# Patient Record
Sex: Female | Born: 1970 | Race: Black or African American | Hispanic: No | Marital: Single | Smoking: Current every day smoker
Health system: Southern US, Community
[De-identification: ages and names within clinical notes are randomized; demographics above are authoritative.]

## PROBLEM LIST (undated history)

## (undated) DIAGNOSIS — F329 Major depressive disorder, single episode, unspecified: Secondary | ICD-10-CM

## (undated) DIAGNOSIS — R519 Headache, unspecified: Secondary | ICD-10-CM

## (undated) DIAGNOSIS — T8859XA Other complications of anesthesia, initial encounter: Secondary | ICD-10-CM

## (undated) DIAGNOSIS — T4145XA Adverse effect of unspecified anesthetic, initial encounter: Secondary | ICD-10-CM

## (undated) DIAGNOSIS — N879 Dysplasia of cervix uteri, unspecified: Secondary | ICD-10-CM

## (undated) DIAGNOSIS — N63 Unspecified lump in unspecified breast: Secondary | ICD-10-CM

## (undated) DIAGNOSIS — F419 Anxiety disorder, unspecified: Secondary | ICD-10-CM

## (undated) DIAGNOSIS — F32A Depression, unspecified: Secondary | ICD-10-CM

## (undated) DIAGNOSIS — R51 Headache: Secondary | ICD-10-CM

## (undated) DIAGNOSIS — N6001 Solitary cyst of right breast: Secondary | ICD-10-CM

## (undated) DIAGNOSIS — K219 Gastro-esophageal reflux disease without esophagitis: Secondary | ICD-10-CM

## (undated) HISTORY — PX: APPENDECTOMY: SHX54

## (undated) HISTORY — DX: Solitary cyst of right breast: N60.01

## (undated) HISTORY — DX: Dysplasia of cervix uteri, unspecified: N87.9

## (undated) HISTORY — PX: CRYOTHERAPY: SHX1416

## (undated) HISTORY — DX: Unspecified lump in unspecified breast: N63.0

## (undated) HISTORY — PX: BREAST MASS EXCISION: SHX1267

---

## 2002-09-19 HISTORY — PX: TUBAL LIGATION: SHX77

## 2002-09-19 HISTORY — PX: BREAST BIOPSY: SHX20

## 2009-04-09 ENCOUNTER — Emergency Department: Payer: Self-pay | Admitting: Unknown Physician Specialty

## 2011-03-15 ENCOUNTER — Emergency Department: Payer: Self-pay | Admitting: Emergency Medicine

## 2011-12-07 ENCOUNTER — Ambulatory Visit: Payer: Self-pay | Admitting: Obstetrics and Gynecology

## 2013-09-19 DIAGNOSIS — N6001 Solitary cyst of right breast: Secondary | ICD-10-CM

## 2013-09-19 HISTORY — DX: Solitary cyst of right breast: N60.01

## 2014-03-31 ENCOUNTER — Ambulatory Visit: Payer: Self-pay | Admitting: Obstetrics and Gynecology

## 2014-04-02 ENCOUNTER — Ambulatory Visit: Payer: Self-pay | Admitting: Obstetrics and Gynecology

## 2014-07-18 DIAGNOSIS — Z720 Tobacco use: Secondary | ICD-10-CM | POA: Insufficient documentation

## 2014-08-25 ENCOUNTER — Ambulatory Visit: Payer: Self-pay | Admitting: Internal Medicine

## 2018-10-08 ENCOUNTER — Ambulatory Visit (INDEPENDENT_AMBULATORY_CARE_PROVIDER_SITE_OTHER): Payer: Managed Care, Other (non HMO) | Admitting: Obstetrics and Gynecology

## 2018-10-08 ENCOUNTER — Encounter: Payer: Self-pay | Admitting: Obstetrics and Gynecology

## 2018-10-08 VITALS — BP 114/66 | HR 90 | Ht 63.0 in | Wt 134.0 lb

## 2018-10-08 DIAGNOSIS — R102 Pelvic and perineal pain: Secondary | ICD-10-CM

## 2018-10-08 DIAGNOSIS — R350 Frequency of micturition: Secondary | ICD-10-CM

## 2018-10-08 LAB — POCT URINALYSIS DIPSTICK
BILIRUBIN UA: NEGATIVE
Blood, UA: NEGATIVE
GLUCOSE UA: NEGATIVE
KETONES UA: NEGATIVE
Leukocytes, UA: NEGATIVE
Nitrite, UA: NEGATIVE
Protein, UA: NEGATIVE
Spec Grav, UA: 1.01 (ref 1.010–1.025)
pH, UA: 6 (ref 5.0–8.0)

## 2018-10-08 NOTE — Progress Notes (Signed)
Patient, No Pcp Per   Chief Complaint  Patient presents with  . Urinary Tract Infection    cramping a lot, frequency at times, no burning sensation/blood noticed/odor x 3 days, started azo on sat  . [employer] Employee    HPI:      [name] is a 48 y.o. No obstetric history on file. who LMP was Patient's last menstrual period was 09/28/2018 (exact date)., presents today for NP UTI sx of urinary frequency/urgency with suprapubic pain starting 3 days ago, feeling irritated/dry with urination today. No hematuria, urine odor, LBP, fevers. Pain so bad initially that pt had vomiting with trying to void. Took AZO with sx improvement. No vag sx. Sx improved today. Drinks lots of caffeine. Hx of UTIs in past but not recently. Last C&S in Epic 2016 was neg with urin sx. Pt also with constipation issues.  Not current on annual. Has 2/20 appt. She is not currently sex active.   History reviewed. No pertinent past medical history.  Past Surgical History:  Procedure Laterality Date  . APPENDECTOMY     pt was 48 yrs old  . TUBAL LIGATION  2004    Family History  Problem Relation Age of Onset  . Hypertension Mother   . Other Father        colon polyps  . Hypertension Father   . Cancer Maternal Grandmother        breast  . Cancer Maternal Grandfather        lung  . Cancer Paternal Grandmother        breast  . Cancer Paternal Grandfather     Social History   Socioeconomic History  . Marital status: Single    Spouse name: Not on file  . Number of children: Not on file  . Years of education: Not on file  . Highest education level: Not on file  Occupational History  . Not on file  Social Needs  . Financial resource strain: Not on file  . Food insecurity:    Worry: Not on file    Inability: Not on file  . Transportation needs:    Medical: Not on file    Non-medical: Not on file  Tobacco Use  . Smoking status: Current Every Day Smoker  . Smokeless tobacco: Never Used    Substance and Sexual Activity  . Alcohol use: Not Currently  . Drug use: Never  . Sexual activity: Not Currently    Birth control/protection: Surgical    Comment: tubal ligation  Lifestyle  . Physical activity:    Days per week: Not on file    Minutes per session: Not on file  . Stress: Not on file  Relationships  . Social connections:    Talks on phone: Not on file    Gets together: Not on file    Attends religious service: Not on file    Active member of club or organization: Not on file    Attends meetings of clubs or organizations: Not on file    Relationship status: Not on file  . Intimate partner violence:    Fear of current or ex partner: Not on file    Emotionally abused: Not on file    Physically abused: Not on file    Forced sexual activity: Not on file  Other Topics Concern  . Not on file  Social History Narrative  . Not on file    No outpatient medications prior to visit.  No facility-administered medications prior to visit.       ROS:  Review of Systems  Constitutional: Negative for fatigue, fever and unexpected weight change.  Respiratory: Negative for cough, shortness of breath and wheezing.   Cardiovascular: Negative for chest pain, palpitations and leg swelling.  Gastrointestinal: Negative for blood in stool, constipation, diarrhea, nausea and vomiting.  Endocrine: Negative for cold intolerance, heat intolerance and polyuria.  Genitourinary: Positive for dysuria, frequency, pelvic pain and urgency. Negative for dyspareunia, flank pain, genital sores, hematuria, menstrual problem, vaginal bleeding, vaginal discharge and vaginal pain.  Musculoskeletal: Negative for back pain, joint swelling and myalgias.  Skin: Negative for rash.  Neurological: Negative for dizziness, syncope, light-headedness, numbness and headaches.  Hematological: Negative for adenopathy.  Psychiatric/Behavioral: Negative for agitation, confusion, sleep disturbance and suicidal  ideas. The patient is not nervous/anxious.    BREAST: No symptoms   OBJECTIVE:   Vitals:  BP 114/66   Pulse 90   Ht 5\' 3"  (1.6 m)   Wt 134 lb (60.8 kg)   LMP 09/28/2018 (Exact Date)   BMI 23.74 kg/m   Physical Exam Vitals signs reviewed.  Constitutional:      Appearance: She is well-developed. She is not ill-appearing or toxic-appearing.  Neck:     Musculoskeletal: Normal range of motion.  Pulmonary:     Effort: Pulmonary effort is normal.  Abdominal:     Palpations: Abdomen is soft. There is no mass.     Tenderness: There is abdominal tenderness. There is no right CVA tenderness, left CVA tenderness or guarding.  Genitourinary:    Pubic Area: No rash.      Labia:        Right: No rash, tenderness or lesion.        Left: No rash, tenderness or lesion.      Vagina: Normal. No vaginal discharge, erythema or tenderness.     Cervix: Normal.     Uterus: Normal. Tender. Not enlarged.      Adnexa: Right adnexa normal.       Right: No mass or tenderness.         Left: No mass or tenderness.    Musculoskeletal: Normal range of motion.  Neurological:     General: No focal deficit present.     Mental Status: She is alert and oriented to person, place, and time.     Cranial Nerves: No cranial nerve deficit.  Psychiatric:        Behavior: Behavior normal.        Thought Content: Thought content normal.        Judgment: Judgment normal.     Results: Results for orders placed or performed in visit on 10/08/18 (from the past 24 hour(s))  POCT Urinalysis Dipstick     Status: Normal   Collection Time: 10/08/18  2:57 PM  Result Value Ref Range   Color, UA yellow    Clarity, UA clear    Glucose, UA Negative Negative   Bilirubin, UA neg    Ketones, UA neg    Spec Grav, UA 1.010 1.010 - 1.025   Blood, UA neg    pH, UA 6.0 5.0 - 8.0   Protein, UA Negative Negative   Urobilinogen, UA     Nitrite, UA neg    Leukocytes, UA Negative Negative   Appearance     Odor        Assessment/Plan: Urinary frequency - Neg UA. Check C&S. Will call with results/sx f/u. If neg, may  be caffeine related. Decrease caffeine/increase water. F/u sooner prn. - Plan: POCT Urinalysis Dipstick, Urine Culture  Pelvic pain - Tender on exam. Check C&S. If neg, will see if sx persist. May be GI related with constipation. F/u sooner prn.    No follow-ups on file.  Diana Avila B. Rocio Roam, PA-C 10/08/2018 3:00 PM

## 2018-10-08 NOTE — Patient Instructions (Signed)
I value your feedback and entrusting us with your care. If you get a [facility] patient survey, I would appreciate you taking the time to let us know about your experience today. Thank you! 

## 2018-10-10 LAB — URINE CULTURE: Organism ID, Bacteria: NO GROWTH

## 2018-10-10 NOTE — Progress Notes (Signed)
Pls let pt know C&S neg. Sx could be caffeine related so pt needs to cut back/stop it. F/u prn.

## 2018-10-10 NOTE — Progress Notes (Signed)
Pt aware.

## 2018-10-29 ENCOUNTER — Ambulatory Visit (INDEPENDENT_AMBULATORY_CARE_PROVIDER_SITE_OTHER): Payer: Managed Care, Other (non HMO) | Admitting: Obstetrics and Gynecology

## 2018-10-29 ENCOUNTER — Encounter: Payer: Self-pay | Admitting: Obstetrics and Gynecology

## 2018-10-29 VITALS — BP 108/70 | HR 97 | Ht 63.0 in | Wt 131.0 lb

## 2018-10-29 DIAGNOSIS — Z124 Encounter for screening for malignant neoplasm of cervix: Secondary | ICD-10-CM

## 2018-10-29 DIAGNOSIS — Z01419 Encounter for gynecological examination (general) (routine) without abnormal findings: Secondary | ICD-10-CM | POA: Diagnosis not present

## 2018-10-29 DIAGNOSIS — R928 Other abnormal and inconclusive findings on diagnostic imaging of breast: Secondary | ICD-10-CM

## 2018-10-29 DIAGNOSIS — Z1239 Encounter for other screening for malignant neoplasm of breast: Secondary | ICD-10-CM

## 2018-10-29 DIAGNOSIS — Z1151 Encounter for screening for human papillomavirus (HPV): Secondary | ICD-10-CM

## 2018-10-29 NOTE — Patient Instructions (Signed)
I value your feedback and entrusting us with your care. If you get a [facility] patient survey, I would appreciate you taking the time to let us know about your experience today. Thank you!  [facility]: [phone]    

## 2018-10-29 NOTE — Progress Notes (Signed)
PCP:  Patient, No Pcp Per   Chief Complaint  Patient presents with  . Gynecologic Exam  . [employer] Employee     HPI:      [name] is a 48 y.o. [id number] who LMP was Patient's last menstrual period was 10/21/2018 (exact date)., presents today for her annual examination.  Her menses are regular every 28-30 days, lasting 4 days.  Dysmenorrhea mild, occurring first 1-2 days of flow. She does not have intermenstrual bleeding.  Sex activity: not sexually active.  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 There is a FH of breast cancer in her MGM and PGM, ages unknown. There is no FH of ovarian cancer. The patient does not do self-breast exams.  Tobacco use: The patient currently smokes 1/2 packs of cigarettes per day for the past many years. Tried chantix without success, trying to quit. Alcohol use: none No drug use.  Exercise: moderately active  She does not get adequate calcium and Vitamin D in her diet. Hx of Vit D deficiency in past. Labs at work.   Past Medical History:  Diagnosis Date  . Breast cyst, right 2015  . Breast mass    with exc in her 22s  . Cervical dysplasia     Past Surgical History:  Procedure Laterality Date  . APPENDECTOMY     pt was 48 yrs old  . BREAST MASS EXCISION     in her 29s  . CRYOTHERAPY     in her 79s  . TUBAL LIGATION  2004    Family History  Problem Relation Age of Onset  . Hypertension Mother   . Other Father        colon polyps  . Hypertension Father   . Breast cancer Maternal Grandmother        ? age  . Cancer Maternal Grandfather        lung  . Breast cancer Paternal Grandmother        ? age  . Cancer Paternal Grandfather     Social History   Socioeconomic History  . Marital status: Single    Spouse name: Not on file  . Number of  children: Not on file  . Years of education: Not on file  . Highest education level: Not on file  Occupational History  . Not on file  Social Needs  . Financial resource strain: Not on file  . Food insecurity:    Worry: Not on file    Inability: Not on file  . Transportation needs:    Medical: Not on file    Non-medical: Not on file  Tobacco Use  . Smoking status: Current Every Day Smoker  . Smokeless tobacco: Never Used  Substance and Sexual Activity  . Alcohol use: Yes    Comment: occ  . Drug use: Never  . Sexual activity: Not Currently    Birth control/protection: Surgical    Comment: tubal ligation  Lifestyle  . Physical activity:    Days per week: Not on file    Minutes per session: Not on file  . Stress: Not on file  Relationships  . Social connections:    Talks on phone: Not on file    Gets together: Not on file    Attends religious service:  Not on file    Active member of club or organization: Not on file    Attends meetings of clubs or organizations: Not on file    Relationship status: Not on file  . Intimate partner violence:    Fear of current or ex partner: Not on file    Emotionally abused: Not on file    Physically abused: Not on file    Forced sexual activity: Not on file  Other Topics Concern  . Not on file  Social History Narrative  . Not on file    No outpatient medications prior to visit.   No facility-administered medications prior to visit.       ROS:  Review of Systems  Constitutional: Negative for fatigue, fever and unexpected weight change.  Respiratory: Negative for cough, shortness of breath and wheezing.   Cardiovascular: Negative for chest pain, palpitations and leg swelling.  Gastrointestinal: Negative for blood in stool, constipation, diarrhea, nausea and vomiting.  Endocrine: Negative for cold intolerance, heat intolerance and polyuria.  Genitourinary: Negative for dyspareunia, dysuria, flank pain, frequency, genital sores,  hematuria, menstrual problem, pelvic pain, urgency, vaginal bleeding, vaginal discharge and vaginal pain.  Musculoskeletal: Negative for back pain, joint swelling and myalgias.  Skin: Negative for rash.  Neurological: Negative for dizziness, syncope, light-headedness, numbness and headaches.  Hematological: Negative for adenopathy.  Psychiatric/Behavioral: Negative for agitation, confusion, sleep disturbance and suicidal ideas. The patient is not nervous/anxious.    BREAST: No symptoms   Objective: BP 108/70   Pulse 97   Ht 5\' 3"  (1.6 m)   Wt 131 lb (59.4 kg)   LMP 10/21/2018 (Exact Date)   BMI 23.21 kg/m    Physical Exam Constitutional:      Appearance: She is well-developed.  Genitourinary:     Vulva, vagina, cervix, uterus, right adnexa and left adnexa normal.     No vulval lesion or tenderness noted.     No vaginal discharge, erythema or tenderness.     No cervical polyp.     Uterus is not enlarged or tender.     No right or left adnexal mass present.     Right adnexa not tender.     Left adnexa not tender.  Neck:     Musculoskeletal: Normal range of motion.     Thyroid: No thyromegaly.  Cardiovascular:     Rate and Rhythm: Normal rate and regular rhythm.     Heart sounds: Normal heart sounds. No murmur.  Pulmonary:     Effort: Pulmonary effort is normal.     Breath sounds: Normal breath sounds.  Chest:     Breasts:        Right: No mass, nipple discharge, skin change or tenderness.        Left: No mass, nipple discharge, skin change or tenderness.  Abdominal:     Palpations: Abdomen is soft.     Tenderness: There is no abdominal tenderness. There is no guarding.  Musculoskeletal: Normal range of motion.  Neurological:     Mental Status: She is alert and oriented to person, place, and time.     Cranial Nerves: No cranial nerve deficit.  Psychiatric:        Behavior: Behavior normal.  Vitals signs reviewed.     Assessment/Plan: Encounter for annual  routine gynecological examination  Cervical cancer screening - Plan: IGP, Aptima HPV  Screening for HPV (human papillomavirus) - Plan: IGP, Aptima HPV  Screening for breast cancer - Check dx mammo and  RT breast u/s, f/u from 2015 cat 3 mammo.  - Plan: US BREAST LTD UNI RIGHT INC AXILLA, MM DIAG BREAST TOMO BILATERAL  Abnormal mammogram of right breast - Plan: US BREAST LTD UNI RIGHT INC AXILLA, MM DIAG BREAST TOMO BILATERAL         GYN counsel breast self exam, mammography screening, adequate intake of calcium and vitamin D, diet and exercise     F/U  Return in about 1 year (around 10/30/2019).  Bereket Gernert B. Greysen Swanton, PA-C 10/29/2018 3:20 PM

## 2018-11-04 LAB — IGP, APTIMA HPV: HPV Aptima: NEGATIVE

## 2018-11-09 ENCOUNTER — Ambulatory Visit
Admission: RE | Admit: 2018-11-09 | Discharge: 2018-11-09 | Disposition: A | Discharge status: Home / Self Care | Payer: Managed Care, Other (non HMO) | Source: Ambulatory Visit | Attending: Obstetrics and Gynecology | Admitting: Obstetrics and Gynecology

## 2018-11-09 ENCOUNTER — Other Ambulatory Visit: Payer: Self-pay | Admitting: Obstetrics and Gynecology

## 2018-11-09 DIAGNOSIS — Z1239 Encounter for other screening for malignant neoplasm of breast: Secondary | ICD-10-CM

## 2018-11-09 DIAGNOSIS — N6320 Unspecified lump in the left breast, unspecified quadrant: Secondary | ICD-10-CM

## 2018-11-09 DIAGNOSIS — R928 Other abnormal and inconclusive findings on diagnostic imaging of breast: Secondary | ICD-10-CM

## 2018-11-12 ENCOUNTER — Other Ambulatory Visit: Payer: Self-pay | Admitting: Obstetrics and Gynecology

## 2018-11-12 DIAGNOSIS — R928 Other abnormal and inconclusive findings on diagnostic imaging of breast: Secondary | ICD-10-CM

## 2018-11-12 DIAGNOSIS — N6001 Solitary cyst of right breast: Secondary | ICD-10-CM

## 2018-11-15 ENCOUNTER — Ambulatory Visit
Admission: RE | Admit: 2018-11-15 | Discharge: 2018-11-15 | Disposition: A | Discharge status: Home / Self Care | Payer: Managed Care, Other (non HMO) | Source: Ambulatory Visit | Attending: Obstetrics and Gynecology | Admitting: Obstetrics and Gynecology

## 2018-11-15 DIAGNOSIS — R928 Other abnormal and inconclusive findings on diagnostic imaging of breast: Secondary | ICD-10-CM | POA: Insufficient documentation

## 2018-11-15 HISTORY — PX: BREAST BIOPSY: SHX20

## 2018-11-16 LAB — SURGICAL PATHOLOGY

## 2018-11-19 ENCOUNTER — Other Ambulatory Visit (HOSPITAL_COMMUNITY)
Admission: RE | Admit: 2018-11-19 | Discharge: 2018-11-19 | Disposition: A | Discharge status: Home / Self Care | Payer: Managed Care, Other (non HMO) | Source: Ambulatory Visit | Attending: Obstetrics and Gynecology | Admitting: Obstetrics and Gynecology

## 2018-11-19 ENCOUNTER — Ambulatory Visit (INDEPENDENT_AMBULATORY_CARE_PROVIDER_SITE_OTHER): Payer: Managed Care, Other (non HMO) | Admitting: Obstetrics and Gynecology

## 2018-11-19 ENCOUNTER — Telehealth: Payer: Self-pay | Admitting: Obstetrics and Gynecology

## 2018-11-19 ENCOUNTER — Encounter: Payer: Self-pay | Admitting: Obstetrics and Gynecology

## 2018-11-19 VITALS — BP 108/58 | HR 85 | Ht 63.0 in | Wt 130.0 lb

## 2018-11-19 DIAGNOSIS — R87612 Low grade squamous intraepithelial lesion on cytologic smear of cervix (LGSIL): Secondary | ICD-10-CM | POA: Insufficient documentation

## 2018-11-19 DIAGNOSIS — N870 Mild cervical dysplasia: Secondary | ICD-10-CM

## 2018-11-19 DIAGNOSIS — N6320 Unspecified lump in the left breast, unspecified quadrant: Secondary | ICD-10-CM

## 2018-11-19 DIAGNOSIS — R897 Abnormal histological findings in specimens from other organs, systems and tissues: Secondary | ICD-10-CM

## 2018-11-19 NOTE — Telephone Encounter (Signed)
I did confirm with Dr. Jeanmarie Plant with the pathology results for Ms. Berkery. She is recommending a surgical referral for excision for the complex sclerosing lesion.   Thanks.  Jetta Lout

## 2018-11-19 NOTE — Progress Notes (Signed)
   GYNECOLOGY CLINIC COLPOSCOPY PROCEDURE NOTE  48 y.o. [id number] here for colposcopy for low-grade squamous intraepithelial neoplasia (LGSIL - encompassing HPV,mild dysplasia,CIN I)  pap smear on 10/29/2018. Discussed underlying role for HPV infection in the development of cervical dysplasia, its natural history and progression/regression, need for surveillance.  Is the patient  pregnant: No LMP: Patient's last menstrual period was 10/21/2018 (exact date). Smoking status:  reports that she has been smoking. She has never used smokeless tobacco.  Patient given informed consent, signed copy in the chart, time out was performed.  The patient was position in dorsal lithotomy position. Speculum was placed the cervix was visualized.   After application of acetic acid colposcopic inspection of the cervix was undertaken.   Colposcopy adequate, full visualization of transformation zone: No acetowhite lesion(s) noted at 12 o'clock; corresponding biopsies obtained.   ECC specimen obtained:  Yes  All specimens were labeled and sent to pathology.   Patient was given post procedure instructions.  Will follow up pathology and manage accordingly.  Routine preventative health maintenance measures emphasized.  Physical Exam Genitourinary:      Encouraged smoking cessation. Given information. Will return to discuss.   Patient felt dizzy and nauseous at the end of the procedure. Reported that she had not had any food today.  Given crackers and a sprite. She felt better after some time of resting and was able to go home.   Adrian Prows MD [facility] 11/19/2018 4:27 PM

## 2018-11-19 NOTE — Telephone Encounter (Signed)
Pt aware of breast bx report. Dr. Jeanmarie Plant recommended surg exc. Will refer to Dr. Bary Castilla for mgmt.

## 2018-11-22 ENCOUNTER — Ambulatory Visit (INDEPENDENT_AMBULATORY_CARE_PROVIDER_SITE_OTHER): Payer: Managed Care, Other (non HMO)

## 2018-11-22 ENCOUNTER — Other Ambulatory Visit: Payer: Self-pay

## 2018-11-22 ENCOUNTER — Encounter: Payer: Self-pay | Admitting: General Surgery

## 2018-11-22 ENCOUNTER — Ambulatory Visit (INDEPENDENT_AMBULATORY_CARE_PROVIDER_SITE_OTHER): Payer: Managed Care, Other (non HMO) | Admitting: General Surgery

## 2018-11-22 VITALS — BP 110/78 | HR 71 | Temp 97.7°F | Resp 18 | Ht 63.0 in | Wt 129.8 lb

## 2018-11-22 DIAGNOSIS — N6489 Other specified disorders of breast: Secondary | ICD-10-CM | POA: Diagnosis not present

## 2018-11-22 DIAGNOSIS — N6320 Unspecified lump in the left breast, unspecified quadrant: Secondary | ICD-10-CM | POA: Diagnosis not present

## 2018-11-22 NOTE — Progress Notes (Signed)
Patient ID: Diana Avila, female   DOB: [birth year], 48 y.o.   MRN: [id number]  Chief Complaint  Patient presents with  . New Patient (Initial Visit)    Left Breast biopsy 11/15/2018    HPI Diana Avila is a 48 y.o. female.  Here today for left breast biopsy consult referred by Elmo Putt Copland. She has felt the area of concern for "some time". Clear discharge bilateral breast usually after showers for at least 10 years. No skin changes. The stereotatic left breast biopsy was 11-15-18, showing complex sclerosing lesion left breast. She does admit to increased caffeine intake. She works at [employer].     HPI  Past Medical History:  Diagnosis Date  . Breast cyst, right 2015  . Breast mass    with exc in her 55s  . Cervical dysplasia     Past Surgical History:  Procedure Laterality Date  . APPENDECTOMY     pt was 48 yrs old  . BREAST BIOPSY Left 2004   neg  . BREAST BIOPSY Left 11/15/2018   affirm stereo biopsy for distortion/ x clip/ FEATURES OF COMPLEX SCLEROSING LESION, WITH SCLEROSING ADENOSIS  . BREAST MASS EXCISION     in her 67s  . CRYOTHERAPY     in her 8s  . TUBAL LIGATION  2004    Family History  Problem Relation Age of Onset  . Hypertension Mother   . Other Father        colon polyps  . Hypertension Father   . Diverticulitis Father   . Gout Father   . Breast cancer Maternal Grandmother        ? age  . Cancer Maternal Grandfather        lung  . Breast cancer Paternal Grandmother        ? age  . Cancer Paternal Grandfather     Social History Social History   Tobacco Use  . Smoking status: Current Every Day Smoker    Packs/day: 0.25  . Smokeless tobacco: Never Used  Substance Use Topics  . Alcohol use: Yes    Comment: occ  . Drug use: Never    Allergies  Allergen Reactions  . Sulfa Antibiotics Hives and Rash    No current outpatient medications on file.   No current facility-administered medications for this visit.     Review of  Systems Review of Systems  Constitutional: Negative.   Respiratory: Negative.   Cardiovascular: Negative.     Blood pressure 110/78, pulse 71, temperature 97.7 F (36.5 C), temperature source Temporal, resp. rate 18, height 5\' 3"  (1.6 m), weight 129 lb 12.8 oz (58.9 kg), last menstrual period 10/21/2018, SpO2 97 %.  Physical Exam Physical Exam Exam conducted with a chaperone present.  Constitutional:      Appearance: She is well-developed.  Eyes:     General: No scleral icterus.    Conjunctiva/sclera: Conjunctivae normal.  Neck:     Musculoskeletal: Normal range of motion and neck supple.  Cardiovascular:     Rate and Rhythm: Normal rate and regular rhythm.     Heart sounds: Normal heart sounds.  Pulmonary:     Effort: Pulmonary effort is normal.     Breath sounds: Normal breath sounds.  Chest:     Breasts:        Right: Nipple discharge present. No inverted nipple, mass, skin change or tenderness.        Left: No inverted nipple, mass, nipple discharge, skin change or tenderness.  Comments: Single duct drainage right breast.  Lymphadenopathy:     Cervical: No cervical adenopathy.     Upper Body:     Right upper body: No supraclavicular or axillary adenopathy.     Left upper body: No supraclavicular or axillary adenopathy.  Skin:    General: Skin is warm and dry.  Neurological:     Mental Status: She is alert and oriented to person, place, and time.  Psychiatric:        Behavior: Behavior normal.     Data Reviewed November 15, 2018:  BREAST, LEFT, LOWER OUTER QUADRANT POSTERIOR; STEREOTACTIC-GUIDED  CORE BIOPSY:  - FEATURES OF COMPLEX SCLEROSING LESION, WITH SCLEROSING ADENOSIS,  COLUMNAR CELL CHANGE, AND APOCRINE METAPLASIA.  - NEGATIVE FOR ATYPIA AND MALIGNANCY.   Limited ultrasound examination was completed to determine if preoperative wire localization would be advisable.  The patient has multiple large cysts in the breast the largest measuring 1.2 x  3.6 x 3.9 cm is located the 2 o'clock position 3 cm from the nipple.  At the 3 o'clock position, 3 cm from the nipple there is an ill-defined hypoechoic area with potentially a clip, but the findings are not distinct enough to proceed for ultrasound guidance during biopsy.  BI-RADS-3  Assessment Radial scar of the left breast.  Plan  Indications for surgical excision to exclude upstaging was discussed.   Recommend excision left breast mass-wire localization   Bra size 36 C.  HPI, assessment, plan and physical exam has been scribed under the direction and in the presence of Robert Bellow, MD. Karie Fetch, RN  HPI, Physical Exam, Assessment and Plan have been scribed under the direction and in the presence of Robert Bellow, MD. [name], CMA  I have completed the exam and reviewed the above documentation for accuracy and completeness.  I agree with the above.  Haematologist has been used and any errors in dictation or transcription are unintentional.  [name], M.D., F.A.C.S.  [name] [name] 11/23/2018, 2:26 PM  Patient's surgery to be scheduled for 12-05-18 at [facility] with Dr. Bary Castilla.  The patient is aware she will be contacted by the [facility] to complete a phone interview sometime in the near future.  The patient is aware to call the office should she have further questions.   Dominga Ferry, CMA

## 2018-11-22 NOTE — Progress Notes (Signed)
CIN1, repeat pap smear in 1 year. Discussed with patient on the phone.  -[place]

## 2018-11-22 NOTE — Patient Instructions (Addendum)
The patient is aware to call back for any questions or new concerns.  Recommend excision left breast mass-wire localization

## 2018-11-23 DIAGNOSIS — N6489 Other specified disorders of breast: Secondary | ICD-10-CM | POA: Insufficient documentation

## 2018-11-26 ENCOUNTER — Other Ambulatory Visit: Payer: Self-pay | Admitting: General Surgery

## 2018-11-26 DIAGNOSIS — N6489 Other specified disorders of breast: Secondary | ICD-10-CM

## 2018-11-27 ENCOUNTER — Telehealth: Payer: Self-pay | Admitting: General Surgery

## 2018-11-27 NOTE — Telephone Encounter (Signed)
Pt advised of pre op date/time and sx date. Sx: 12/05/18 with Dr Orlene Och breast excisional biopsy with NL.  Pre op: 11/29/2018  Between 9-1:00pm-phone interview.   Patient made aware to arrive at [facility] at 7:45am the morning of surgery. Patient made aware that she does not have to call the day prior to surgery for arrival time.

## 2018-11-29 ENCOUNTER — Encounter
Admission: RE | Admit: 2018-11-29 | Discharge: 2018-11-29 | Disposition: A | Discharge status: Home / Self Care | Payer: Managed Care, Other (non HMO) | Source: Ambulatory Visit | Attending: General Surgery | Admitting: General Surgery

## 2018-11-29 ENCOUNTER — Other Ambulatory Visit: Payer: Self-pay

## 2018-11-29 HISTORY — DX: Major depressive disorder, single episode, unspecified: F32.9

## 2018-11-29 HISTORY — DX: Other complications of anesthesia, initial encounter: T88.59XA

## 2018-11-29 HISTORY — DX: Gastro-esophageal reflux disease without esophagitis: K21.9

## 2018-11-29 HISTORY — DX: Anxiety disorder, unspecified: F41.9

## 2018-11-29 HISTORY — DX: Headache, unspecified: R51.9

## 2018-11-29 HISTORY — DX: Headache: R51

## 2018-11-29 HISTORY — DX: Depression, unspecified: F32.A

## 2018-11-29 HISTORY — DX: Adverse effect of unspecified anesthetic, initial encounter: T41.45XA

## 2018-11-29 NOTE — Patient Instructions (Addendum)
Your procedure is scheduled on: 12-05-18 Report to [facility] AT 7:45 AM Remember: Instructions that are not followed completely may result in serious medical risk, up to and including death, or upon the discretion of your surgeon and anesthesiologist your surgery may need to be rescheduled.    _x___ 1. Do not eat food after midnight the night before your procedure. You may drink clear liquids up to 2 hours before you are scheduled to arrive at the hospital for your procedure.  Do not drink clear liquids within 2 hours of your scheduled arrival to the hospital.  Clear liquids include  --Water or Apple juice without pulp  --Clear carbohydrate beverage such as ClearFast or Gatorade  --Black Coffee or Clear Tea (No milk, no creamers, do not add anything to the coffee or Tea   ____Ensure clear carbohydrate drink on the way to the hospital for bariatric patients  ____Ensure clear carbohydrate drink 3 hours before surgery for Dr Dwyane Luo patients if physician instructed.   No gum chewing or hard candies.     __x__ 2. No Alcohol for 24 hours before or after surgery.   __x__3. No Smoking or e-cigarettes for 24 prior to surgery.  Do not use any chewable tobacco products for at least 6 hour prior to surgery   ____  4. Bring all medications with you on the day of surgery if instructed.    __x__ 5. Notify your doctor if there is any change in your medical condition     (cold, fever, infections).    x___6. On the morning of surgery brush your teeth with toothpaste and water.  You may rinse your mouth with mouth wash if you wish.  Do not swallow any toothpaste or mouthwash.   Do not wear jewelry, make-up, hairpins, clips or nail polish.  Do not wear lotions, powders, or perfumes. You may wear deodorant.  Do not shave 48 hours prior to surgery. Men may shave face and neck.  Do not bring valuables to the hospital.    [facility] is not responsible for any belongings or valuables.    Contacts, dentures or bridgework may not be worn into surgery.  Leave your suitcase in the car. After surgery it may be brought to your room.  For patients admitted to the hospital, discharge time is determined by your                       treatment team.  _  Patients discharged the day of surgery will not be allowed to drive home.  You will need someone to drive you home and stay with you the night of your procedure.    Please read over the following fact sheets that you were given:   [facility] Preparing for Surgery and or MRSA Information   ____ Take anti-hypertensive listed below, cardiac, seizure, asthma,anti-reflux and psychiatric medicines. These include:  1. NONE  2.  3.  4.  5.  6.  ____Fleets enema or Magnesium Citrate as directed.   ____ Use CHG Soap or sage wipes as directed on instruction sheet   ____ Use inhalers on the day of surgery and bring to hospital day of surgery  ____ Stop Metformin and Janumet 2 days prior to surgery.    ____ Take 1/2 of usual insulin dose the night before surgery and none on the morning surgery.   ____ Follow recommendations from Cardiologist, Pulmonologist or PCP regarding stopping Aspirin, Coumadin, Plavix ,Eliquis, Effient, or Pradaxa, and  Pletal.  ____Stop Anti-inflammatories such as Advil, Aleve, Ibuprofen, Motrin, Naproxen, Naprosyn, Goodies powders or aspirin products NOW-OK to take Tylenol    ____ Stop supplements until after surgery.     ____ Bring C-Pap to the hospital.

## 2018-12-03 ENCOUNTER — Ambulatory Visit (INDEPENDENT_AMBULATORY_CARE_PROVIDER_SITE_OTHER): Payer: Managed Care, Other (non HMO) | Admitting: Obstetrics and Gynecology

## 2018-12-03 ENCOUNTER — Other Ambulatory Visit: Payer: Self-pay

## 2018-12-03 ENCOUNTER — Encounter: Payer: Self-pay | Admitting: Obstetrics and Gynecology

## 2018-12-03 VITALS — BP 100/70 | HR 89 | Ht 63.0 in | Wt 129.0 lb

## 2018-12-03 DIAGNOSIS — N760 Acute vaginitis: Secondary | ICD-10-CM

## 2018-12-03 DIAGNOSIS — Z720 Tobacco use: Secondary | ICD-10-CM | POA: Diagnosis not present

## 2018-12-03 DIAGNOSIS — B9689 Other specified bacterial agents as the cause of diseases classified elsewhere: Secondary | ICD-10-CM | POA: Diagnosis not present

## 2018-12-03 LAB — POCT WET PREP WITH KOH
Clue Cells Wet Prep HPF POC: POSITIVE
KOH PREP POC: POSITIVE — AB
Trichomonas, UA: NEGATIVE
Yeast Wet Prep HPF POC: NEGATIVE

## 2018-12-03 MED ORDER — NICOTINE 7 MG/24HR TD PT24: 7.0000 mg | MEDICATED_PATCH | Freq: Every day | TRANSDERMAL | 0 refills | Status: DC

## 2018-12-03 MED ORDER — NICOTINE 14 MG/24HR TD PT24: 14.0000 mg | MEDICATED_PATCH | Freq: Every day | TRANSDERMAL | 0 refills | Status: DC

## 2018-12-03 MED ORDER — FLUCONAZOLE 150 MG PO TABS: 150.0000 mg | ORAL_TABLET | Freq: Once | ORAL | 0 refills | Status: AC

## 2018-12-03 MED ORDER — METRONIDAZOLE 500 MG PO TABS: 500.0000 mg | ORAL_TABLET | Freq: Two times a day (BID) | ORAL | 0 refills | Status: AC

## 2018-12-03 NOTE — Patient Instructions (Signed)
I value your feedback and entrusting us with your care. If you get a [facility] patient survey, I would appreciate you taking the time to let us know about your experience today. Thank you! 

## 2018-12-03 NOTE — Progress Notes (Signed)
Amorie Rentz, Deirdre Evener, PA-C   Chief Complaint  Patient presents with  . smoking visit    Cessation visit  . Vaginal odor    irritation and little discharge, no itchiness x 3 days  . [employer] Employee    HPI:      [name] is a 48 y.o. [id number] who LMP was Patient's last menstrual period was 11/25/2018 (exact date)., presents today for increased d/c with odor, irritation for several days. No recent abx use, no meds to treat. Hx of BV in past. Not sex active. Uses water to wash, no dryer sheets, no probiotic use.   Current on annual 2/20. Having radial scar removed 12/05/18. 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  Pt also wants to quit tobacco. Smokes 1/2 ppd for many yrs. Tried chantix in past with cessation but caused GI issues and pt couldn't finish tx, so resumed smoking. Interested in trying nicoderm patch but wants Rx for FSA.    Past Medical History:  Diagnosis Date  . Anxiety    H/O  . Breast cyst, right 2015  . Breast mass    with exc in her 7s  . Cervical dysplasia   . Complication of anesthesia    WOKE UP DURING APPENDECTOMY  . Depression    H/O  . GERD (gastroesophageal reflux disease)    NO MEDS  . Headache    MIGRAINES    Past Surgical History:  Procedure Laterality Date  . APPENDECTOMY     pt was 48 yrs old-DEVELOPED FLUID ON RIGHT LUNG AFTER SURGERY-NO PROBLEMS SINCE THEN  . BREAST BIOPSY Left 2004   neg  . BREAST BIOPSY Left 11/15/2018   affirm stereo biopsy for distortion/ x clip/ FEATURES OF COMPLEX SCLEROSING LESION, WITH SCLEROSING ADENOSIS  . BREAST MASS EXCISION     in her 46s  . CRYOTHERAPY     in her 11s  . TUBAL LIGATION  2004    Family History  Problem Relation Age of Onset  . Hypertension Mother   . Other Father        colon polyps  . Hypertension Father   . Diverticulitis Father   . Gout Father   . Breast cancer Maternal Grandmother        ? age  . Cancer Maternal Grandfather        lung  . Breast cancer Paternal Grandmother         ? age  . Cancer Paternal Grandfather     Social History   Socioeconomic History  . Marital status: Single    Spouse name: Not on file  . Number of children: Not on file  . Years of education: Not on file  . Highest education level: Not on file  Occupational History  . Not on file  Social Needs  . Financial resource strain: Not on file  . Food insecurity:    Worry: Not on file    Inability: Not on file  . Transportation needs:    Medical: Not on file    Non-medical: Not on file  Tobacco Use  . Smoking status: Current Every Day Smoker    Packs/day: 0.50    Years: 15.00    Pack years: 7.50    Types: Cigarettes  . Smokeless tobacco: Never Used  Substance and Sexual Activity  . Alcohol use: Yes    Comment: occ  . Drug use: Never  . Sexual activity: Not Currently    Birth control/protection: Surgical  Comment: tubal ligation  Lifestyle  . Physical activity:    Days per week: Not on file    Minutes per session: Not on file  . Stress: Not on file  Relationships  . Social connections:    Talks on phone: Not on file    Gets together: Not on file    Attends religious service: Not on file    Active member of club or organization: Not on file    Attends meetings of clubs or organizations: Not on file    Relationship status: Not on file  . Intimate partner violence:    Fear of current or ex partner: Not on file    Emotionally abused: Not on file    Physically abused: Not on file    Forced sexual activity: Not on file  Other Topics Concern  . Not on file  Social History Narrative  . Not on file    Outpatient Medications Prior to Visit  Medication Sig Dispense Refill  . diphenhydrAMINE (BENADRYL) 25 MG tablet Take 25 mg by mouth every 6 (six) hours as needed.     No facility-administered medications prior to visit.       ROS:  Review of Systems  Constitutional: Negative for fever.  Gastrointestinal: Negative for blood in stool, constipation, diarrhea,  nausea and vomiting.  Genitourinary: Positive for vaginal discharge. Negative for dyspareunia, dysuria, flank pain, frequency, hematuria, urgency, vaginal bleeding and vaginal pain.  Musculoskeletal: Negative for back pain.  Skin: Negative for rash.    OBJECTIVE:   Vitals:  BP 100/70   Pulse 89   Ht 5\' 3"  (1.6 m)   Wt 129 lb (58.5 kg)   LMP 11/25/2018 (Exact Date)   BMI 22.85 kg/m   Physical Exam Vitals signs reviewed.  Constitutional:      Appearance: She is well-developed.  Pulmonary:     Effort: Pulmonary effort is normal.  Genitourinary:    General: Normal vulva.     Pubic Area: No rash.      Labia:        Right: No rash, tenderness or lesion.        Left: No rash, tenderness or lesion.      Vagina: Vaginal discharge present. No erythema or tenderness.     Cervix: Normal.     Uterus: Normal. Not enlarged and not tender.      Adnexa: Right adnexa normal and left adnexa normal.       Right: No mass or tenderness.         Left: No mass or tenderness.    Musculoskeletal: Normal range of motion.  Neurological:     Mental Status: She is alert and oriented to person, place, and time.  Psychiatric:        Behavior: Behavior normal.        Thought Content: Thought content normal.     Results: Results for orders placed or performed in visit on 12/03/18 (from the past 24 hour(s))  POCT Wet Prep with KOH     Status: Abnormal   Collection Time: 12/03/18  4:55 PM  Result Value Ref Range   Trichomonas, UA Negative    Clue Cells Wet Prep HPF POC pos    Epithelial Wet Prep HPF POC     Yeast Wet Prep HPF POC neg    Bacteria Wet Prep HPF POC     RBC Wet Prep HPF POC     WBC Wet Prep HPF POC     KOH  Prep POC Positive (A) Negative     Assessment/Plan: Bacterial vaginosis - Pos sx/wet prep. Rx flagyl. No EtOH. Rx diflucan prn yeast vag after abx. - Plan: POCT Wet Prep with KOH, metroNIDAZOLE (FLAGYL) 500 MG tablet, fluconazole (DIFLUCAN) 150 MG tablet  Tobacco use -  Cessation options discussed. Pt wants to try patch. Rx eRxd for FSA benefit. F/u prn.  - Plan: nicotine (NICODERM CQ - DOSED IN MG/24 HOURS) 14 mg/24hr patch, nicotine (NICODERM CQ - DOSED IN MG/24 HR) 7 mg/24hr patch    Meds ordered this encounter  Medications  . metroNIDAZOLE (FLAGYL) 500 MG tablet    Sig: Take 1 tablet (500 mg total) by mouth 2 (two) times daily for 7 days.    Dispense:  14 tablet    Refill:  0    Order Specific Question:   Supervising Provider    Answer:   Gae Dry [id number]  . fluconazole (DIFLUCAN) 150 MG tablet    Sig: Take 1 tablet (150 mg total) by mouth once for 1 dose.    Dispense:  1 tablet    Refill:  0    Order Specific Question:   Supervising Provider    Answer:   Gae Dry [id number]  . nicotine (NICODERM CQ - DOSED IN MG/24 HOURS) 14 mg/24hr patch    Sig: Place 1 patch (14 mg total) onto the skin daily. For 6 wks, then decrease to 7 mg patch    Dispense:  42 patch    Refill:  0    Order Specific Question:   Supervising Provider    Answer:   Gae Dry [id number]  . nicotine (NICODERM CQ - DOSED IN MG/24 HR) 7 mg/24hr patch    Sig: Place 1 patch (7 mg total) onto the skin daily. For 2 wks, then stop    Dispense:  14 patch    Refill:  0    Order Specific Question:   Supervising Provider    Answer:   Gae Dry [[id number]]      Return if symptoms worsen or fail to improve.  Levester Waldridge B. Deshunda Thackston, PA-C 12/03/2018 4:56 PM

## 2018-12-05 ENCOUNTER — Ambulatory Visit: Payer: Managed Care, Other (non HMO) | Admitting: Registered Nurse

## 2018-12-05 ENCOUNTER — Ambulatory Visit
Admission: RE | Admit: 2018-12-05 | Discharge: 2018-12-05 | Disposition: A | Discharge status: Home / Self Care | Payer: Managed Care, Other (non HMO) | Source: Ambulatory Visit | Attending: General Surgery | Admitting: General Surgery

## 2018-12-05 ENCOUNTER — Ambulatory Visit
Admission: RE | Admit: 2018-12-05 | Discharge: 2018-12-05 | Disposition: A | Discharge status: Home / Self Care | Payer: Managed Care, Other (non HMO) | Attending: General Surgery | Admitting: General Surgery

## 2018-12-05 ENCOUNTER — Encounter
Admission: RE | Disposition: A | Discharge status: Home / Self Care | Payer: Self-pay | Source: Home / Self Care | Attending: General Surgery

## 2018-12-05 ENCOUNTER — Encounter: Payer: Self-pay | Admitting: *Deleted

## 2018-12-05 ENCOUNTER — Other Ambulatory Visit: Payer: Self-pay

## 2018-12-05 DIAGNOSIS — N6022 Fibroadenosis of left breast: Secondary | ICD-10-CM | POA: Insufficient documentation

## 2018-12-05 DIAGNOSIS — N6489 Other specified disorders of breast: Secondary | ICD-10-CM

## 2018-12-05 DIAGNOSIS — F17210 Nicotine dependence, cigarettes, uncomplicated: Secondary | ICD-10-CM | POA: Diagnosis not present

## 2018-12-05 DIAGNOSIS — N6092 Unspecified benign mammary dysplasia of left breast: Secondary | ICD-10-CM | POA: Insufficient documentation

## 2018-12-05 DIAGNOSIS — D493 Neoplasm of unspecified behavior of breast: Secondary | ICD-10-CM

## 2018-12-05 HISTORY — PX: BREAST BIOPSY: SHX20

## 2018-12-05 HISTORY — PX: BREAST LUMPECTOMY: SHX2

## 2018-12-05 LAB — POCT PREGNANCY, URINE: Preg Test, Ur: NEGATIVE

## 2018-12-05 SURGERY — BREAST BIOPSY WITH NEEDLE LOCALIZATION
Anesthesia: General | Laterality: Left

## 2018-12-05 MED ORDER — DEXAMETHASONE SODIUM PHOSPHATE 4 MG/ML IJ SOLN
INTRAMUSCULAR | Status: AC
  Filled 2018-12-05: qty 1

## 2018-12-05 MED ORDER — FENTANYL CITRATE (PF) 100 MCG/2ML IJ SOLN
INTRAMUSCULAR | Status: AC
  Filled 2018-12-05: qty 2

## 2018-12-05 MED ORDER — ONDANSETRON HCL 4 MG/2ML IJ SOLN
INTRAMUSCULAR | Status: DC | PRN
  Administered 2018-12-05: 4 mg via INTRAVENOUS

## 2018-12-05 MED ORDER — LIDOCAINE HCL (PF) 2 % IJ SOLN
INTRAMUSCULAR | Status: AC
  Filled 2018-12-05: qty 10

## 2018-12-05 MED ORDER — GLYCOPYRROLATE 0.2 MG/ML IJ SOLN
INTRAMUSCULAR | Status: DC | PRN
  Administered 2018-12-05: 0.2 mg via INTRAVENOUS

## 2018-12-05 MED ORDER — HYDROMORPHONE HCL 1 MG/ML IJ SOLN: 0.2500 mg | INTRAMUSCULAR | Status: DC | PRN

## 2018-12-05 MED ORDER — KETOROLAC TROMETHAMINE 30 MG/ML IJ SOLN
30.0000 mg | Freq: Once | INTRAMUSCULAR | Status: AC
  Administered 2018-12-05: 30 mg via INTRAVENOUS

## 2018-12-05 MED ORDER — FENTANYL CITRATE (PF) 100 MCG/2ML IJ SOLN
INTRAMUSCULAR | Status: DC | PRN
  Administered 2018-12-05: 50 ug via INTRAVENOUS
  Administered 2018-12-05 (×2): 25 ug via INTRAVENOUS

## 2018-12-05 MED ORDER — MIDAZOLAM HCL 2 MG/2ML IJ SOLN
INTRAMUSCULAR | Status: DC | PRN
  Administered 2018-12-05: 2 mg via INTRAVENOUS

## 2018-12-05 MED ORDER — CELECOXIB 200 MG PO CAPS
ORAL_CAPSULE | ORAL | Status: AC
  Administered 2018-12-05: 200 mg via ORAL
  Filled 2018-12-05: qty 1

## 2018-12-05 MED ORDER — KETOROLAC TROMETHAMINE 30 MG/ML IJ SOLN
INTRAMUSCULAR | Status: AC
  Filled 2018-12-05: qty 1

## 2018-12-05 MED ORDER — PROPOFOL 10 MG/ML IV BOLUS
INTRAVENOUS | Status: AC
  Filled 2018-12-05: qty 20

## 2018-12-05 MED ORDER — GABAPENTIN 300 MG PO CAPS
ORAL_CAPSULE | ORAL | Status: AC
  Administered 2018-12-05: 300 mg via ORAL
  Filled 2018-12-05: qty 1

## 2018-12-05 MED ORDER — SUCCINYLCHOLINE CHLORIDE 20 MG/ML IJ SOLN
INTRAMUSCULAR | Status: DC | PRN
  Administered 2018-12-05: 100 mg via INTRAVENOUS
  Administered 2018-12-05: 20 mg via INTRAVENOUS

## 2018-12-05 MED ORDER — FAMOTIDINE 20 MG PO TABS
20.0000 mg | ORAL_TABLET | Freq: Once | ORAL | Status: AC
  Administered 2018-12-05: 20 mg via ORAL

## 2018-12-05 MED ORDER — ONDANSETRON HCL 4 MG/2ML IJ SOLN
INTRAMUSCULAR | Status: AC
  Filled 2018-12-05: qty 2

## 2018-12-05 MED ORDER — PROPOFOL 10 MG/ML IV BOLUS
INTRAVENOUS | Status: DC | PRN
  Administered 2018-12-05: 150 mg via INTRAVENOUS
  Administered 2018-12-05 (×2): 50 mg via INTRAVENOUS

## 2018-12-05 MED ORDER — GLYCOPYRROLATE 0.2 MG/ML IJ SOLN
INTRAMUSCULAR | Status: AC
  Filled 2018-12-05: qty 1

## 2018-12-05 MED ORDER — PHENYLEPHRINE HCL 10 MG/ML IJ SOLN
INTRAMUSCULAR | Status: DC | PRN
  Administered 2018-12-05 (×6): 100 ug via INTRAVENOUS

## 2018-12-05 MED ORDER — HYDROCODONE-ACETAMINOPHEN 5-325 MG PO TABS: 1.0000 | ORAL_TABLET | ORAL | 0 refills | Status: DC | PRN

## 2018-12-05 MED ORDER — ACETAMINOPHEN 10 MG/ML IV SOLN
INTRAVENOUS | Status: DC | PRN
  Administered 2018-12-05: 1000 mg via INTRAVENOUS

## 2018-12-05 MED ORDER — DEXAMETHASONE SODIUM PHOSPHATE 10 MG/ML IJ SOLN
INTRAMUSCULAR | Status: DC | PRN
  Administered 2018-12-05: 5 mg via INTRAVENOUS

## 2018-12-05 MED ORDER — LIDOCAINE HCL (CARDIAC) PF 100 MG/5ML IV SOSY
PREFILLED_SYRINGE | INTRAVENOUS | Status: DC | PRN
  Administered 2018-12-05: 60 mg via INTRAVENOUS

## 2018-12-05 MED ORDER — GABAPENTIN 300 MG PO CAPS
300.0000 mg | ORAL_CAPSULE | ORAL | Status: AC
  Administered 2018-12-05: 300 mg via ORAL

## 2018-12-05 MED ORDER — EPINEPHRINE PF 1 MG/ML IJ SOLN
INTRAMUSCULAR | Status: AC
  Filled 2018-12-05: qty 1

## 2018-12-05 MED ORDER — LACTATED RINGERS IV SOLN
INTRAVENOUS | Status: DC
  Administered 2018-12-05: 09:00:00 via INTRAVENOUS

## 2018-12-05 MED ORDER — BUPIVACAINE-EPINEPHRINE (PF) 0.5% -1:200000 IJ SOLN
INTRAMUSCULAR | Status: DC | PRN
  Administered 2018-12-05: 25 mL

## 2018-12-05 MED ORDER — KETOROLAC TROMETHAMINE 30 MG/ML IJ SOLN
INTRAMUSCULAR | Status: AC
  Administered 2018-12-05: 30 mg via INTRAVENOUS
  Filled 2018-12-05: qty 1

## 2018-12-05 MED ORDER — ACETAMINOPHEN NICU IV SYRINGE 10 MG/ML
INTRAVENOUS | Status: AC
  Filled 2018-12-05: qty 1

## 2018-12-05 MED ORDER — BUPIVACAINE HCL (PF) 0.5 % IJ SOLN
INTRAMUSCULAR | Status: AC
  Filled 2018-12-05: qty 30

## 2018-12-05 MED ORDER — FAMOTIDINE 20 MG PO TABS
ORAL_TABLET | ORAL | Status: AC
  Administered 2018-12-05: 20 mg via ORAL
  Filled 2018-12-05: qty 1

## 2018-12-05 MED ORDER — SEVOFLURANE IN SOLN
RESPIRATORY_TRACT | Status: AC
  Filled 2018-12-05: qty 250

## 2018-12-05 MED ORDER — CELECOXIB 200 MG PO CAPS
200.0000 mg | ORAL_CAPSULE | ORAL | Status: AC
  Administered 2018-12-05: 200 mg via ORAL

## 2018-12-05 MED ORDER — MIDAZOLAM HCL 2 MG/2ML IJ SOLN
INTRAMUSCULAR | Status: AC
  Filled 2018-12-05: qty 2

## 2018-12-05 SURGICAL SUPPLY — 48 items
BENZOIN TINCTURE PRP APPL 2/3 (GAUZE/BANDAGES/DRESSINGS) ×3 IMPLANT
BINDER BREAST LRG (GAUZE/BANDAGES/DRESSINGS) IMPLANT
BINDER BREAST MEDIUM (GAUZE/BANDAGES/DRESSINGS) IMPLANT
BINDER BREAST XLRG (GAUZE/BANDAGES/DRESSINGS) IMPLANT
BINDER BREAST XXLRG (GAUZE/BANDAGES/DRESSINGS) IMPLANT
BLADE SURG 15 STRL SS SAFETY (BLADE) ×6 IMPLANT
CANISTER SUCT 1200ML W/VALVE (MISCELLANEOUS) ×3 IMPLANT
CHLORAPREP W/TINT 26 (MISCELLANEOUS) ×6 IMPLANT
CLOSURE WOUND 1/2 X4 (GAUZE/BANDAGES/DRESSINGS) ×2
CNTNR SPEC 2.5X3XGRAD LEK (MISCELLANEOUS) ×1
CONT SPEC 4OZ STER OR WHT (MISCELLANEOUS) ×2
CONTAINER SPEC 2.5X3XGRAD LEK (MISCELLANEOUS) ×1 IMPLANT
COVER PROBE FLX POLY STRL (MISCELLANEOUS) ×3 IMPLANT
COVER WAND RF STERILE (DRAPES) ×3 IMPLANT
DEVICE DUBIN SPECIMEN MAMMOGRA (MISCELLANEOUS) ×3 IMPLANT
DRAPE CHEST BREAST 77X106 FENE (MISCELLANEOUS) ×3 IMPLANT
DRAPE LAPAROTOMY 100X77 ABD (DRAPES) ×3 IMPLANT
DRSG GAUZE FLUFF 36X18 (GAUZE/BANDAGES/DRESSINGS) ×3 IMPLANT
DRSG TELFA 4X3 1S NADH ST (GAUZE/BANDAGES/DRESSINGS) ×6 IMPLANT
ELECT CAUTERY BLADE TIP 2.5 (TIP) ×3
ELECT REM PT RETURN 9FT ADLT (ELECTROSURGICAL) ×3
ELECTRODE CAUTERY BLDE TIP 2.5 (TIP) ×1 IMPLANT
ELECTRODE REM PT RTRN 9FT ADLT (ELECTROSURGICAL) ×1 IMPLANT
GAUZE 4X4 16PLY RFD (DISPOSABLE) ×3 IMPLANT
GLOVE BIO SURGEON STRL SZ7.5 (GLOVE) ×3 IMPLANT
GLOVE INDICATOR 8.0 STRL GRN (GLOVE) ×3 IMPLANT
GOWN STRL REUS W/ TWL LRG LVL3 (GOWN DISPOSABLE) ×2 IMPLANT
GOWN STRL REUS W/TWL LRG LVL3 (GOWN DISPOSABLE) ×4
KIT TURNOVER KIT A (KITS) ×3 IMPLANT
LABEL OR SOLS (LABEL) ×3 IMPLANT
MARGIN MAP 10MM (MISCELLANEOUS) ×3 IMPLANT
NEEDLE HYPO 22GX1.5 SAFETY (NEEDLE) ×3 IMPLANT
NEEDLE HYPO 25X1 1.5 SAFETY (NEEDLE) ×3 IMPLANT
PACK BASIN MINOR ARMC (MISCELLANEOUS) ×3 IMPLANT
RETRACTOR RING XSMALL (MISCELLANEOUS) IMPLANT
RTRCTR WOUND ALEXIS 13CM XS SH (MISCELLANEOUS)
STRIP CLOSURE SKIN 1/2X4 (GAUZE/BANDAGES/DRESSINGS) ×4 IMPLANT
SUT ETHILON 3-0 FS-10 30 BLK (SUTURE) ×6
SUT VIC AB 2-0 CT1 27 (SUTURE) ×2
SUT VIC AB 2-0 CT1 TAPERPNT 27 (SUTURE) ×1 IMPLANT
SUT VIC AB 2-0 SH 27 (SUTURE) ×2
SUT VIC AB 2-0 SH 27XBRD (SUTURE) ×1 IMPLANT
SUT VIC AB 4-0 FS2 27 (SUTURE) ×6 IMPLANT
SUTURE EHLN 3-0 FS-10 30 BLK (SUTURE) ×2 IMPLANT
SWABSTK COMLB BENZOIN TINCTURE (MISCELLANEOUS) ×6 IMPLANT
SYR 10ML LL (SYRINGE) ×3 IMPLANT
TAPE TRANSPORE STRL 2 31045 (GAUZE/BANDAGES/DRESSINGS) ×3 IMPLANT
WATER STERILE IRR 1000ML POUR (IV SOLUTION) ×3 IMPLANT

## 2018-12-05 NOTE — Anesthesia Postprocedure Evaluation (Signed)
Anesthesia Post Note  Patient: Diana Avila  Procedure(s) Performed: BREAST BIOPSY WITH NEEDLE LOCALIZATION, LEFT (Left )  Patient location during evaluation: PACU Anesthesia Type: General Level of consciousness: awake and alert Pain management: pain level controlled Vital Signs Assessment: post-procedure vital signs reviewed and stable Respiratory status: spontaneous breathing, nonlabored ventilation and respiratory function stable Cardiovascular status: blood pressure returned to baseline and stable Postop Assessment: no apparent nausea or vomiting Anesthetic complications: no     Last Vitals:  Vitals:   12/05/18 1208 12/05/18 1250  BP: 117/77 126/87  Pulse: 84 68  Resp: 18 18  Temp: 36.6 C   SpO2: 98% 100%    Last Pain:  Vitals:   12/05/18 1208  TempSrc: Tympanic  PainSc: [place]

## 2018-12-05 NOTE — Transfer of Care (Signed)
Immediate Anesthesia Transfer of Care Note  Patient: Diana Avila  Procedure(s) Performed: Procedure(s): BREAST BIOPSY WITH NEEDLE LOCALIZATION, LEFT (Left)  Patient Location: PACU  Anesthesia Type:General  Level of Consciousness: sedated  Airway & Oxygen Therapy: Patient Spontanous Breathing and Patient connected to face mask oxygen  Post-op Assessment: Report given to RN and Post -op Vital signs reviewed and stable  Post vital signs: Reviewed and stable  Last Vitals:  Vitals:   12/05/18 0704 12/05/18 1115  BP: 120/87 110/68  Pulse: 77 85  Resp: 16 19  Temp: 37.3 C (!) 36.2 C  SpO2: 75% 883%    Complications: No apparent anesthesia complications

## 2018-12-05 NOTE — Discharge Instructions (Signed)

## 2018-12-05 NOTE — Op Note (Signed)
Preoperative diagnosis: Radial scar left breast.  Postoperative diagnosis: Same.  Operative procedure left breast saw biopsy with wire localization.  Operating Surgeon: [name], MD.  Anesthesia: General endotracheal, Marcaine 0.5% with 1 to 200,000 units of epinephrine, 25 cc.  Estimated blood loss: 10 cc.  Clinical note: This 48 year old woman had an abnormal mammogram and underwent 2 biopsies 1 showing evidence of radial scar.  She underwent wire localization this morning as the biopsy site was not evident from the procedure completed on November 15, 2018.  Multiple cysts were also evident within the breast.  Wire localization was well-tolerated.  The wire was placed near the ribbon clip which was the site of the radial scar.  Operative note: The patient underwent general anesthesia by LMA and later changed to general endotracheal.  The breast was prepped with ChloraPrep and draped.  Field block anesthesia was established.  The wire came in from the 5 o'clock position into the area of the retroareolar tissue.  Ultrasound was used to identify the track of the wire and the thickened portion was just at the edge of the areola.  An incision from the 3 to 6 o'clock position was made and carried out through skin subtendinous tissue with hemostasis achieved electrocautery.  The wire was brought into the wound.  On the wire localization films the area in question was deep within the breast but in the patient supine there was very little breast depth, less than 2.5 cm.  The tissue superficial to the wire superior and all the way down to and including the pectoralis fascia was excised.  Specimen radiograph confirmed a more superficial barrel clip but not the ribbon.  Palpation showed multiple cysts and several of these were drained to allow better exploration.  There was no tissue distortion to suggest a clip and elected to reexcised the superior and lateral margins.  Radiograph confirmed no additional  clip material.  At this point it was elected to discontinue the procedure.  The breast parenchyma and pectoralis fascia were elevated circumferentially and then approximated with interrupted 2-0 Vicryl sutures to reconstruct the breast volume.  The superficial breast parenchyma was approximated in similar fashion.  The skin was closed with interrupted 4-0 Vicryl subcuticular sutures.  Benzoin and Steri-Strips followed by Telfa, fluff gauze and a compressive wrap were applied.  The patient tolerated the procedure well and was taken recovery in stable condition.

## 2018-12-05 NOTE — Anesthesia Procedure Notes (Signed)
Procedure Name: LMA Insertion Date/Time: 12/05/2018 9:23 AM Performed by: Doreen Salvage, CRNA Pre-anesthesia Checklist: Patient identified, Patient being monitored, Timeout performed, Emergency Drugs available and Suction available Patient Re-evaluated:Patient Re-evaluated prior to induction Oxygen Delivery Method: Circle system utilized Preoxygenation: Pre-oxygenation with 100% oxygen Induction Type: IV induction Ventilation: Mask ventilation without difficulty LMA: LMA inserted LMA Size: 3.5 Tube type: Oral Number of attempts: 1 Placement Confirmation: positive ETCO2 and breath sounds checked- equal and bilateral Tube secured with: Tape Dental Injury: Teeth and Oropharynx as per pre-operative assessment

## 2018-12-05 NOTE — Progress Notes (Signed)
Patient notified that index clip was not in the resected specimen.   Will await pathology report before making plans for re-excision.

## 2018-12-05 NOTE — H&P (Signed)
No change in clinical condition or exam. For excision of radial scar left breast.

## 2018-12-05 NOTE — Anesthesia Post-op Follow-up Note (Signed)
Anesthesia QCDR form completed.        

## 2018-12-05 NOTE — Anesthesia Procedure Notes (Signed)
Procedure Name: Intubation Date/Time: 12/05/2018 10:13 AM Performed by: Doreen Salvage, CRNA Pre-anesthesia Checklist: Patient identified, Patient being monitored, Timeout performed, Emergency Drugs available and Suction available Patient Re-evaluated:Patient Re-evaluated prior to induction Oxygen Delivery Method: Circle system utilized Preoxygenation: Pre-oxygenation with 100% oxygen Induction Type: IV induction Ventilation: Mask ventilation without difficulty Laryngoscope Size: Mac, 3 and McGraph Grade View: Grade I Tube type: Oral Tube size: 7.0 mm Number of attempts: 1 Airway Equipment and Method: Stylet Placement Confirmation: ETT inserted through vocal cords under direct vision,  positive ETCO2 and breath sounds checked- equal and bilateral Secured at: 21 cm Tube secured with: Tape Dental Injury: Teeth and Oropharynx as per pre-operative assessment  Comments: LMA removed. Patient intubated with ease after laryngospasm

## 2018-12-05 NOTE — Anesthesia Preprocedure Evaluation (Addendum)
Anesthesia Evaluation  Patient identified by MRN, date of birth, ID band Patient awake    Reviewed: Allergy & Precautions, H&P , NPO status , Patient's Chart, lab work & pertinent test results  History of Anesthesia Complications (+) AWARENESS UNDER ANESTHESIA and history of anesthetic complications ("woke up during appendectomy")  Airway Mallampati: III       Dental  (+) Teeth Intact   Pulmonary neg COPD, Current Smoker,           Cardiovascular (-) angina(-) Past MI and (-) Cardiac Stents negative cardio ROS  (-) dysrhythmias      Neuro/Psych  Headaches, PSYCHIATRIC DISORDERS Anxiety Depression    GI/Hepatic Neg liver ROS, GERD  ,  Endo/Other  negative endocrine ROS  Renal/GU      Musculoskeletal   Abdominal   Peds  Hematology negative hematology ROS (+)   Anesthesia Other Findings Past Medical History: No date: Anxiety     Comment:  H/O 2015: Breast cyst, right No date: Breast mass     Comment:  with exc in her 47s No date: Cervical dysplasia No date: Complication of anesthesia     Comment:  WOKE UP DURING APPENDECTOMY No date: Depression     Comment:  H/O No date: GERD (gastroesophageal reflux disease)     Comment:  NO MEDS No date: Headache     Comment:  MIGRAINES  Past Surgical History: No date: APPENDECTOMY     Comment:  pt was 48 yrs old-DEVELOPED FLUID ON RIGHT LUNG AFTER               SURGERY-NO PROBLEMS SINCE THEN 2004: BREAST BIOPSY; Left     Comment:  neg 11/15/2018: BREAST BIOPSY; Left     Comment:  affirm stereo biopsy for distortion/ x clip/ FEATURES OF              COMPLEX SCLEROSING LESION, WITH SCLEROSING ADENOSIS 12/05/2018: BREAST LUMPECTOMY; Left     Comment:  radial scar No date: BREAST MASS EXCISION     Comment:  in her 47s No date: CRYOTHERAPY     Comment:  in her 47s 2004: TUBAL LIGATION     Reproductive/Obstetrics negative OB ROS                            Anesthesia Physical Anesthesia Plan  ASA: II  Anesthesia Plan: General LMA   Post-op Pain Management:    Induction:   PONV Risk Score and Plan: Dexamethasone, Ondansetron, Midazolam and Treatment may vary due to age or medical condition  Airway Management Planned:   Additional Equipment:   Intra-op Plan:   Post-operative Plan:   Informed Consent: I have reviewed the patients History and Physical, chart, labs and discussed the procedure including the risks, benefits and alternatives for the proposed anesthesia with the patient or authorized representative who has indicated his/her understanding and acceptance.     Dental Advisory Given  Plan Discussed with: Anesthesiologist and CRNA  Anesthesia Plan Comments: (BIS monitor)      Anesthesia Quick Evaluation

## 2018-12-06 ENCOUNTER — Telehealth: Payer: Self-pay | Admitting: General Surgery

## 2018-12-06 LAB — SURGICAL PATHOLOGY

## 2018-12-06 NOTE — Telephone Encounter (Signed)
The patient was notified that the pathologist identified the recent biopsy site as well as the evidence of the complex sclerosing lesion.  No invasive or in situ cancer.  He did find to micropapillary areas with atypical cells, margins of resection are clear.  The patient reports she is little sore, especially in her back and shoulder blade, and this may be secondary to the small pillow placed behind her shoulder at the time of surgery.  She will follow-up next week as planned.

## 2018-12-13 ENCOUNTER — Other Ambulatory Visit: Payer: Self-pay

## 2018-12-13 ENCOUNTER — Encounter: Payer: Self-pay | Admitting: General Surgery

## 2018-12-13 ENCOUNTER — Ambulatory Visit (INDEPENDENT_AMBULATORY_CARE_PROVIDER_SITE_OTHER): Payer: Managed Care, Other (non HMO) | Admitting: General Surgery

## 2018-12-13 VITALS — BP 107/76 | HR 73 | Temp 97.7°F | Resp 18 | Ht 63.0 in | Wt 128.0 lb

## 2018-12-13 DIAGNOSIS — N6092 Unspecified benign mammary dysplasia of left breast: Secondary | ICD-10-CM

## 2018-12-13 MED ORDER — TAMOXIFEN CITRATE 20 MG PO TABS: 20.0000 mg | ORAL_TABLET | Freq: Every day | ORAL | 3 refills | Status: DC

## 2018-12-13 NOTE — Progress Notes (Signed)
Patient ID: Diana Avila, female   DOB: October 03, 1970, 48 y.o.   MRN: [id number]  Chief Complaint  Patient presents with  . Routine Post Op    1 week post op left breast BX sx 12/05/18    HPI Diana Avila is a 48 y.o. female.  Here for postoperative visit, left breast biopsy 12-05-18.  The patient reports soreness that is been well controlled with oral analgesics.  She no longer notices the breast being "heavy" when she removes her bra.  HPI  Past Medical History:  Diagnosis Date  . Anxiety    H/O  . Breast cyst, right 2015  . Breast mass    with exc in her 60s  . Cervical dysplasia   . Complication of anesthesia    WOKE UP DURING APPENDECTOMY  . Depression    H/O  . GERD (gastroesophageal reflux disease)    NO MEDS  . Headache    MIGRAINES    Past Surgical History:  Procedure Laterality Date  . APPENDECTOMY     pt was 48 yrs old-DEVELOPED FLUID ON RIGHT LUNG AFTER SURGERY-NO PROBLEMS SINCE THEN  . BREAST BIOPSY Left 2004   neg  . BREAST BIOPSY Left 11/15/2018   affirm stereo biopsy for distortion/ x clip/ FEATURES OF COMPLEX SCLEROSING LESION, WITH SCLEROSING ADENOSIS  . BREAST BIOPSY Left 12/05/2018   Procedure: BREAST BIOPSY WITH NEEDLE LOCALIZATION, LEFT;  Surgeon: Robert Bellow, MD;  Location: [facility] ORS;  Service: General;  Laterality: Left;  . BREAST LUMPECTOMY Left 12/05/2018   radial scar  . BREAST MASS EXCISION     in her 45s  . CRYOTHERAPY     in her 73s  . TUBAL LIGATION  2004    Family History  Problem Relation Age of Onset  . Hypertension Mother   . Other Father        colon polyps  . Hypertension Father   . Diverticulitis Father   . Gout Father   . Breast cancer Maternal Grandmother        ? age  . Cancer Maternal Grandfather        lung  . Breast cancer Paternal Grandmother        ? age  . Cancer Paternal Grandfather     Social History Social History   Tobacco Use  . Smoking status: Current Every Day Smoker    Packs/day: 0.50   Years: 15.00    Pack years: 7.50    Types: Cigarettes  . Smokeless tobacco: Never Used  Substance Use Topics  . Alcohol use: Yes    Comment: occ  . Drug use: Never    Allergies  Allergen Reactions  . Sulfa Antibiotics Hives and Rash    Current Outpatient Medications  Medication Sig Dispense Refill  . diphenhydrAMINE (BENADRYL) 25 MG tablet Take 25 mg by mouth every 6 (six) hours as needed.    Marland Kitchen HYDROcodone-acetaminophen (NORCO/VICODIN) 5-325 MG tablet Take 1 tablet by mouth every 4 (four) hours as needed for moderate pain. 15 tablet 0  . nicotine (NICODERM CQ - DOSED IN MG/24 HOURS) 14 mg/24hr patch Place 1 patch (14 mg total) onto the skin daily. For 6 wks, then decrease to 7 mg patch (Patient not taking: Reported on 12/13/2018) 42 patch 0  . nicotine (NICODERM CQ - DOSED IN MG/24 HR) 7 mg/24hr patch Place 1 patch (7 mg total) onto the skin daily. For 2 wks, then stop (Patient not taking: Reported on 12/13/2018) 14 patch 0  .  tamoxifen (NOLVADEX) 20 MG tablet Take 1 tablet (20 mg total) by mouth daily. 90 tablet 3   No current facility-administered medications for this visit.     Review of Systems Review of Systems  Constitutional: Negative.   Respiratory: Negative.   Cardiovascular: Negative.     Blood pressure 107/76, pulse 73, temperature 97.7 F (36.5 C), temperature source Temporal, resp. rate 18, height 5\' 3"  (1.6 m), weight 128 lb (58.1 kg), last menstrual period 11/25/2018, SpO2 99 %.  Physical Exam Physical Exam Exam conducted with a chaperone present.  Constitutional:      Appearance: Normal appearance.  Chest:    Skin:    General: Skin is warm and dry.  Neurological:     Mental Status: She is alert and oriented to person, place, and time.  Psychiatric:        Mood and Affect: Mood normal.     Data Reviewed December 06, 2018 wide excision   A. BREAST, LEFT, RETROAREOLAR; WIDE EXCISION:  - FOCAL ATYPICAL DUCT HYPERPLASIA, MICROPAPILLARY TYPE.  - COMPLEX  SCLEROSING LESION, SCLEROSING ADENOSIS AND COLUMNAR CELL  CHANGE WITH MICROCALCIFICATIONS, FIBROADENOMA/FIBROADENOMATOID CHANGE.  - ATYPICAL DUCT HYPERPLASIA IS NOT PRESENTAT SURGICAL MARGIN.  - BIOPSY SITE (RECENT) IDENTIFIED.  - BARREL SHAPED BIOPSY CLIP IDENTIFIED (NOT FROM RECENT BIOPSY  PROCEDURE).     B. BREAST, LEFT, REEXCISION OF LATERAL MARGIN:  - FOCAL ATYPICAL DUCTAL HYPERPLASIA, MICROPAPILLARY TYPE.  - SCLEROSING ADENOSIS WITH MICROCALCIFICATIONS AND FIBROCYSTIC CHANGE.  - ATYPICAL DUCTAL HYPERPLASIA IS NOT PRESENT AT SURGICAL MARGIN.  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  Plan Candidate for chemoprevention based on finding of ADH.  Pros and cons of antiestrogen therapy reviewed.  Potential for 1) hot flashes/night sweats; 2) DVT: (1%) reviewed.  Advantage of taking a daily pediatric aspirin reviewed.  Patient is amenable to give it a 1 month try.  We will reassess her at that time.  Follow up in one month The patient is aware to call back for any questions or new concerns.    HPI, assessment, plan and physical exam has been scribed under the direction and in the presence of Robert Bellow, MD. Karie Fetch, RN  I have completed the exam and reviewed the above documentation for accuracy and completeness.  I agree with the above.  Haematologist has been used and any errors in dictation or transcription are unintentional.  [name], M.D., F.A.C.S.  Diana Avila 12/13/2018, 9:26 AM

## 2018-12-13 NOTE — Patient Instructions (Signed)
The patient is aware to call back for any questions or new concerns. May continue to wear bra for support as desired

## 2018-12-27 ENCOUNTER — Telehealth: Payer: Self-pay

## 2018-12-27 ENCOUNTER — Telehealth: Payer: Self-pay | Admitting: General Surgery

## 2018-12-27 NOTE — Telephone Encounter (Signed)
Spoke with patient. She states at this time, after urinating she has a white discharge, non smelling on the tissue.  She does not have any vaginal itching. She also states she has vaginal dryness. She states she also has epigastric pain in the center of her chest under her breast that comes on "like a contraction and then goes away. No fever, chills, nausea or vomiting, otherwise feels of.   She started Tomoxifen 12/23/2018. We discussed that the vaginal discharge and dryness are side effects that usually will go away with time however we are not sure if this could be causing the pain that she has described above.  I will contact Dr.Byrnett for advise on how to proceed and contact patient once I hear from him.

## 2018-12-27 NOTE — Telephone Encounter (Signed)
I contacted the patient regarding her call from earlier this morning regarding vaginal discharge without odor or discomfort, the episodic sharp subxiphoid pains that had developed 3 days ago (similar to what she experienced while trying to take Chantix in years past) and sleep disorders that she temporally relates to the initiation of tamoxifen 5 days ago.  The vaginal symptoms are likely related to beginning estrogen deprivation.  Wear the sling will only know in a few weeks and she was encouraged to do symptomatic things for comfort, but not to be alarmed.  She had been encouraged to make use of a antifungal by her GYN, I do not think this is necessary.  The patient has had vaginal yeast infections in the past and this does not "feel" similar.  Regarding the intermittent, sharp epigastric pain: It is not associated nausea, vomiting, diarrhea or change in appetite.  She denies any fever or chills.  It is similar to what she is experienced in the past with other medications.  No reflux symptoms.  I am not aware of any correlate between the tamoxifen and the sort of symptoms, but if not so prominent does not interfere with her activities and we will plan for observation at present.  She has not found Benadryl helpful for sleep.  She was encouraged to try taking the tamoxifen in the morning to see if this alleviated the sleep disturbance.  As she is relatively petite, will have her make use of 1/2 tablet (10 mg) of tamoxifen for the next week and see if her symptoms improve.  She will call with a progress report at that time.

## 2018-12-27 NOTE — Telephone Encounter (Signed)
Pt called; thinks she has a yeast inf.  [phone]  Pt is on tamoxifen; has vaginal dryness; has little white balls on tissue with wiping; is also using replens. Wanted to know if this is a side effect of tamoxifen.  Adv I had not heard of these little white balls she is describing.  She isn't itching.  Adv 3d monistat and if no better to call Mon to be seen.  Also adv she call provider who prescribed the tamoxifen to see if side effect and/or pharmacist.

## 2018-12-27 NOTE — Telephone Encounter (Signed)
Patient is calling and is asking for one of of the nurses to give her call. Please call patient and advise.

## 2019-01-15 ENCOUNTER — Ambulatory Visit (INDEPENDENT_AMBULATORY_CARE_PROVIDER_SITE_OTHER): Payer: Managed Care, Other (non HMO) | Admitting: General Surgery

## 2019-01-15 ENCOUNTER — Encounter: Payer: Self-pay | Admitting: General Surgery

## 2019-01-15 ENCOUNTER — Other Ambulatory Visit: Payer: Self-pay

## 2019-01-15 VITALS — BP 122/81 | HR 74 | Temp 97.5°F | Ht 63.0 in | Wt 127.0 lb

## 2019-01-15 DIAGNOSIS — N6092 Unspecified benign mammary dysplasia of left breast: Secondary | ICD-10-CM

## 2019-01-15 NOTE — Progress Notes (Signed)
Patient ID: Diana Avila, female   DOB: 1970-09-26, 48 y.o.   MRN: [id number]  Chief Complaint  Patient presents with  . Follow-up    HPI Diana Avila is a 48 y.o. female here today for her foloow up left breast biopsy done on 12/05/2018. The area is sore at times with a pulling sensation.  The patient found the Tamoxifen produced unacceptable GI distress. She is not interested in a trial of an alternative estrogen suppression agent (Evista).  HPI  Past Medical History:  Diagnosis Date  . Anxiety    H/O  . Breast cyst, right 2015  . Breast mass    with exc in her 67s  . Cervical dysplasia   . Complication of anesthesia    WOKE UP DURING APPENDECTOMY  . Depression    H/O  . GERD (gastroesophageal reflux disease)    NO MEDS  . Headache    MIGRAINES    Past Surgical History:  Procedure Laterality Date  . APPENDECTOMY     pt was 48 yrs old-DEVELOPED FLUID ON RIGHT LUNG AFTER SURGERY-NO PROBLEMS SINCE THEN  . BREAST BIOPSY Left 2004   neg  . BREAST BIOPSY Left 11/15/2018   affirm stereo biopsy for distortion/ x clip/ FEATURES OF COMPLEX SCLEROSING LESION, WITH SCLEROSING ADENOSIS  . BREAST BIOPSY Left 12/05/2018   Procedure: BREAST BIOPSY WITH NEEDLE LOCALIZATION, LEFT;  Surgeon: Robert Bellow, MD;  Location: [facility] OR[facility];  Service: General;  Laterality: Left;  . BREAST LUMPECTOMY Left 12/05/2018   radial scar  . BREAST MASS EXCISION     in her 44s  . CRYOTHERAPY     in her 47s  . TUBAL LIGATION  2004    Family History  Problem Relation Age of Onset  . Hypertension Mother   . Other Father        colon polyps  . Hypertension Father   . Diverticulitis Father   . Gout Father   . Breast cancer Maternal Grandmother        ? age  . Cancer Maternal Grandfather        lung  . Breast cancer Paternal Grandmother        ? age  . Cancer Paternal Grandfather     Social History Social History   Tobacco Use  . Smoking status: Current Every Day Smoker    Packs/day:  0.50    Years: 15.00    Pack years: 7.50    Types: Cigarettes  . Smokeless tobacco: Never Used  Substance Use Topics  . Alcohol use: Yes    Comment: occ  . Drug use: Never    Allergies  Allergen Reactions  . Sulfa Antibiotics Hives and Rash    Current Outpatient Medications  Medication Sig Dispense Refill  . diphenhydrAMINE (BENADRYL) 25 MG tablet Take 25 mg by mouth every 6 (six) hours as needed.    . nicotine (NICODERM CQ - DOSED IN MG/24 HOURS) 14 mg/24hr patch Place 1 patch (14 mg total) onto the skin daily. For 6 wks, then decrease to 7 mg patch 42 patch 0  . nicotine (NICODERM CQ - DOSED IN MG/24 HR) 7 mg/24hr patch Place 1 patch (7 mg total) onto the skin daily. For 2 wks, then stop 14 patch 0  . tamoxifen (NOLVADEX) 20 MG tablet Take 1 tablet (20 mg total) by mouth daily. 90 tablet 3   No current facility-administered medications for this visit.     Review of Systems Review of Systems  Constitutional: Negative.   Respiratory: Negative.   Cardiovascular: Negative.     Blood pressure 122/81, pulse 74, temperature (!) 97.5 F (36.4 C), height 5\' 3"  (1.6 m), weight 127 lb (57.6 kg), last menstrual period 01/04/2019, SpO2 100 %.  Physical Exam Physical Exam Constitutional:      Appearance: Normal appearance.  Neck:     Musculoskeletal: Normal range of motion and neck supple.  Chest:     Breasts:        Left: No inverted nipple, mass, nipple discharge, skin change or tenderness.    Skin:    General: Skin is dry.  Neurological:     General: No focal deficit present.     Mental Status: She is alert and oriented to person, place, and time.     Data Reviewed "X" marker was not in the resected specimen, but the pathology sample showed the biopsy cavity and complex sclerosing lesion as noted on original sample.   Assessment ADH with negative margins.   Plan The patient was expected to get a 2% absolute risk reduction with chemoprevention.   No additional  therapy at this time.   Return in two months.The patient is aware to call back for any questions or concerns.   HPI, Physical Exam, Assessment and Plan have been scribed under the direction and in the presence of [name], MD.  Gaspar Cola, CMA   HPI, Physical Exam, Assessment and Plan have been scribed under the direction and in the presence of Robert Bellow, MD  Concepcion Living, LPN  I have completed the exam and reviewed the above documentation for accuracy and completeness.  I agree with the above.  Haematologist has been used and any errors in dictation or transcription are unintentional.  [name], M.D., F.A.C.S.  Diana Avila 01/17/2019, 2:55 PM

## 2019-01-15 NOTE — Patient Instructions (Signed)
Return in two months.The patient is aware to call back for any questions or concerns.

## 2019-02-15 ENCOUNTER — Encounter: Payer: Self-pay | Admitting: Maternal Newborn

## 2019-02-15 ENCOUNTER — Ambulatory Visit (INDEPENDENT_AMBULATORY_CARE_PROVIDER_SITE_OTHER): Payer: Managed Care, Other (non HMO) | Admitting: Maternal Newborn

## 2019-02-15 ENCOUNTER — Other Ambulatory Visit: Payer: Self-pay

## 2019-02-15 VITALS — BP 110/60 | Ht 63.0 in | Wt 128.2 lb

## 2019-02-15 DIAGNOSIS — R399 Unspecified symptoms and signs involving the genitourinary system: Secondary | ICD-10-CM | POA: Diagnosis not present

## 2019-02-15 DIAGNOSIS — B373 Candidiasis of vulva and vagina: Secondary | ICD-10-CM | POA: Diagnosis not present

## 2019-02-15 DIAGNOSIS — B3731 Acute candidiasis of vulva and vagina: Secondary | ICD-10-CM

## 2019-02-15 DIAGNOSIS — N760 Acute vaginitis: Secondary | ICD-10-CM

## 2019-02-15 LAB — POCT URINALYSIS DIPSTICK: Protein, UA: POSITIVE — AB

## 2019-02-15 MED ORDER — NITROFURANTOIN MACROCRYSTAL 100 MG PO CAPS: 100.0000 mg | ORAL_CAPSULE | Freq: Two times a day (BID) | ORAL | 0 refills | Status: AC

## 2019-02-15 MED ORDER — FLUCONAZOLE 150 MG PO TABS: 150.0000 mg | ORAL_TABLET | Freq: Once | ORAL | 0 refills | Status: AC

## 2019-02-15 NOTE — Progress Notes (Signed)
Obstetrics & Gynecology Office Visit   Chief Complaint:  Chief Complaint  Patient presents with  . Vaginal Discharge    no odor, itchiness/irritation for about a week  . Urinary Tract Infection    frequency, no burning sention urinating, no blood, pelvic cramping as of today    History of Present Illness: Diana Avila has had vaginitis symptoms of itching and irritation for about a week. She has some white discharge which does not have an odor, no thick consistency, although she did have thick discharge several weeks ago. She also has urinary frequency without dysuria and has had some intermittent cramping today. No hematuria. She has had recurrent vaginal infections in the past and does not use dryer sheets, scented soaps and detergents, etc.  Review of Systems  Constitutional: Negative.   HENT: Positive for congestion.   Eyes: Negative.   Respiratory: Negative.   Cardiovascular: Negative.   Gastrointestinal: Positive for diarrhea.  Genitourinary: Positive for frequency. Negative for dysuria.       Vaginal discharge, itching, and irritation  Musculoskeletal: Negative.   Skin: Negative.   Neurological: Positive for headaches.  Endo/Heme/Allergies: Positive for environmental allergies.  Psychiatric/Behavioral: Negative.   All other systems reviewed and are negative.   Past Medical History:  Past Medical History:  Diagnosis Date  . Anxiety    H/O  . Breast cyst, right 2015  . Breast mass    with exc in her 16s  . Cervical dysplasia   . Complication of anesthesia    WOKE UP DURING APPENDECTOMY  . Depression    H/O  . GERD (gastroesophageal reflux disease)    NO MEDS  . Headache    MIGRAINES    Past Surgical History:  Past Surgical History:  Procedure Laterality Date  . APPENDECTOMY     pt was 48 yrs old-DEVELOPED FLUID ON RIGHT LUNG AFTER SURGERY-NO PROBLEMS SINCE THEN  . BREAST BIOPSY Left 2004   neg  . BREAST BIOPSY Left 11/15/2018   affirm stereo biopsy for  distortion/ x clip/ FEATURES OF COMPLEX SCLEROSING LESION, WITH SCLEROSING ADENOSIS  . BREAST BIOPSY Left 12/05/2018   Procedure: BREAST BIOPSY WITH NEEDLE LOCALIZATION, LEFT;  Surgeon: Robert Bellow, MD;  Location: [facility] OR[facility];  Service: General;  Laterality: Left;  . BREAST LUMPECTOMY Left 12/05/2018   radial scar  . BREAST MASS EXCISION     in her 71s  . CRYOTHERAPY     in her 80s  . TUBAL LIGATION  2004    Gynecologic History: Patient's last menstrual period was 12/30/2018 (approximate).  Obstetric History: [id number]  Family History:  Family History  Problem Relation Age of Onset  . Hypertension Mother   . Other Father        colon polyps  . Hypertension Father   . Diverticulitis Father   . Gout Father   . Breast cancer Maternal Grandmother        ? age  . Cancer Maternal Grandfather        lung  . Breast cancer Paternal Grandmother        ? age  . Cancer Paternal Grandfather     Social History:  Social History   Socioeconomic History  . Marital status: Single    Spouse name: Not on file  . Number of children: Not on file  . Years of education: Not on file  . Highest education level: Not on file  Occupational History  . Not on file  Social Needs  .  Financial resource strain: Not on file  . Food insecurity:    Worry: Not on file    Inability: Not on file  . Transportation needs:    Medical: Not on file    Non-medical: Not on file  Tobacco Use  . Smoking status: Current Every Day Smoker    Packs/day: 0.50    Years: 15.00    Pack years: 7.50    Types: Cigarettes  . Smokeless tobacco: Never Used  Substance and Sexual Activity  . Alcohol use: Yes    Comment: occ  . Drug use: Never  . Sexual activity: Yes    Birth control/protection: Surgical    Comment: tubal ligation  Lifestyle  . Physical activity:    Days per week: Not on file    Minutes per session: Not on file  . Stress: Not on file  Relationships  . Social connections:    Talks on phone:  Not on file    Gets together: Not on file    Attends religious service: Not on file    Active member of club or organization: Not on file    Attends meetings of clubs or organizations: Not on file    Relationship status: Not on file  . Intimate partner violence:    Fear of current or ex partner: Not on file    Emotionally abused: Not on file    Physically abused: Not on file    Forced sexual activity: Not on file  Other Topics Concern  . Not on file  Social History Narrative  . Not on file    Allergies:  Allergies  Allergen Reactions  . Sulfa Antibiotics Hives and Rash    Medications: Prior to Admission medications   Medication Sig Start Date End Date Taking? Authorizing Provider  diphenhydrAMINE (BENADRYL) 25 MG tablet Take 25 mg by mouth every 6 (six) hours as needed.   Yes [provider]  nicotine (NICODERM CQ - DOSED IN MG/24 HOURS) 14 mg/24hr patch Place 1 patch (14 mg total) onto the skin daily. For 6 wks, then decrease to 7 mg patch 0/93/23  Yes Copland, Deirdre Evener, PA-C    Physical Exam Vitals:  Vitals:   02/15/19 1420  BP: 110/60   Patient's last menstrual period was 12/30/2018 (approximate).  General: NAD HEENT: normocephalic, anicteric Pulmonary: No increased work of breathing Genitourinary:  External: Mild redness of external female genitalia.  Normal  urethral meatus, normal Bartholin's and Skene's glands.    Vagina: Normal vaginal mucosa, no evidence of prolapse.  No blood, no visible discharge.  Neurologic: Grossly intact Psychiatric: mood appropriate, affect full  Wet prep positive for yeast, negative whiff, negative clue cells, no trichomonads, pH < 4.5.  Assessment: 48 y.o. [id number] with UTI symptoms and vulvovaginal candidiasis.  Plan: Problem List Items Addressed This Visit    None    Visit Diagnoses    Vaginitis and vulvovaginitis    -  Primary   UTI symptoms       Relevant Medications   nitrofurantoin (MACRODANTIN) 100 MG capsule    Other Relevant Orders   POCT Urinalysis Dipstick (Completed)   Urine Culture   Vulvovaginal candidiasis       Relevant Medications   fluconazole (DIFLUCAN) 150 MG tablet     1) Yeast seen on wet prep, Rx sent for Diflucan.  2) UA with 2+ leukocytes, empiric treatment due to results + symptoms, urine culture sent. Will notify patient if change in antibiotics needed with results.  Avel Sensor, CNM 02/15/2019  3:15 PM

## 2019-02-17 LAB — URINE CULTURE

## 2019-02-26 ENCOUNTER — Other Ambulatory Visit: Payer: Self-pay

## 2019-02-26 ENCOUNTER — Encounter: Payer: Self-pay | Admitting: General Surgery

## 2019-02-26 ENCOUNTER — Ambulatory Visit (INDEPENDENT_AMBULATORY_CARE_PROVIDER_SITE_OTHER): Payer: Managed Care, Other (non HMO) | Admitting: General Surgery

## 2019-02-26 VITALS — BP 109/77 | HR 80 | Temp 97.7°F | Resp 16 | Ht 66.0 in | Wt 128.4 lb

## 2019-02-26 DIAGNOSIS — N6092 Unspecified benign mammary dysplasia of left breast: Secondary | ICD-10-CM

## 2019-02-26 NOTE — Progress Notes (Signed)
Patient ID: Diana Avila, female   DOB: 02-19-1971, 48 y.o.   MRN: [id number]  Chief Complaint  Patient presents with  . Follow-up    Breast surgery left    HPI Diana Avila is a 48 y.o. female.  Here today for follow up left breast surgery. Patient states she has a stabbing pain intermittently since surgery. No nipple drainage.  HPI  Past Medical History:  Diagnosis Date  . Anxiety    H/O  . Breast cyst, right 2015  . Breast mass    with exc in her 60s  . Cervical dysplasia   . Complication of anesthesia    WOKE UP DURING APPENDECTOMY  . Depression    H/O  . GERD (gastroesophageal reflux disease)    NO MEDS  . Headache    MIGRAINES    Past Surgical History:  Procedure Laterality Date  . APPENDECTOMY     pt was 48 yrs old-DEVELOPED FLUID ON RIGHT LUNG AFTER SURGERY-NO PROBLEMS SINCE THEN  . BREAST BIOPSY Left 2004   neg  . BREAST BIOPSY Left 11/15/2018   affirm stereo biopsy for distortion/ x clip/ FEATURES OF COMPLEX SCLEROSING LESION, WITH SCLEROSING ADENOSIS  . BREAST BIOPSY Left 12/05/2018   Procedure: BREAST BIOPSY WITH NEEDLE LOCALIZATION, LEFT;  Surgeon: Robert Bellow, MD;  Location: [facility] ORS;  Service: General;  Laterality: Left;  . BREAST LUMPECTOMY Left 12/05/2018   radial scar  . BREAST MASS EXCISION     in her 66s  . CRYOTHERAPY     in her 45s  . TUBAL LIGATION  2004    Family History  Problem Relation Age of Onset  . Hypertension Mother   . Other Father        colon polyps  . Hypertension Father   . Diverticulitis Father   . Gout Father   . Breast cancer Maternal Grandmother        ? age  . Cancer Maternal Grandfather        lung  . Breast cancer Paternal Grandmother        ? age  . Cancer Paternal Grandfather     Social History Social History   Tobacco Use  . Smoking status: Current Every Day Smoker    Packs/day: 0.50    Years: 15.00    Pack years: 7.50    Types: Cigarettes  . Smokeless tobacco: Never Used  Substance Use  Topics  . Alcohol use: Yes    Comment: occ  . Drug use: Never    Allergies  Allergen Reactions  . Sulfa Antibiotics Hives and Rash    Current Outpatient Medications  Medication Sig Dispense Refill  . cetirizine (ZYRTEC) 10 MG chewable tablet Chew 10 mg by mouth daily.     No current facility-administered medications for this visit.     Review of Systems Review of Systems  Constitutional: Negative.   Respiratory: Negative.     Blood pressure 109/77, pulse 80, temperature 97.7 F (36.5 C), temperature source Temporal, resp. rate 16, height 5\' 6"  (1.676 m), weight 128 lb 6.4 oz (58.2 kg), SpO2 98 %.  Physical Exam Physical Exam Exam conducted with a chaperone present.  Constitutional:      Appearance: She is well-developed.  Eyes:     General: No scleral icterus.    Conjunctiva/sclera: Conjunctivae normal.  Neck:     Musculoskeletal: Normal range of motion.  Cardiovascular:     Rate and Rhythm: Normal rate and regular rhythm.  Heart sounds: Normal heart sounds.  Pulmonary:     Effort: Pulmonary effort is normal.     Breath sounds: Normal breath sounds.  Chest:    Lymphadenopathy:     Cervical: No cervical adenopathy.  Skin:    General: Skin is warm and dry.  Neurological:     Mental Status: She is alert and oriented to person, place, and time.     Data Reviewed "X" marker was not in the resected specimen, but the pathology sample showed the biopsy cavity and complex sclerosing lesion as noted on original sample.  A. BREAST, LEFT, RETROAREOLAR; WIDE EXCISION:  - FOCAL ATYPICAL DUCT HYPERPLASIA, MICROPAPILLARY TYPE.  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  - ATYPICAL DUCT HYPERPLASIA IS NOT PRESENTAT SURGICAL MARGIN.  - BIOPSY SITE (RECENT) IDENTIFIED.  - BARREL SHAPED BIOPSY CLIP IDENTIFIED (NOT FROM RECENT BIOPSY  PROCEDURE).   B. BREAST, LEFT,  REEXCISION OF LATERAL MARGIN:  - FOCAL ATYPICAL DUCTAL HYPERPLASIA, MICROPAPILLARY TYPE.  - SCLEROSING ADENOSIS WITH MICROCALCIFICATIONS AND FIBROCYSTIC CHANGE.  - ATYPICAL DUCTAL HYPERPLASIA IS NOT PRESENT AT SURGICAL MARGIN.   From her January 15, 2019 evaluation: The patient found the Tamoxifen produced unacceptable GI distress. She is not interested in a trial of an alternative estrogen suppression agent (Evista). The patient was expected to get a 2% absolute risk reduction with chemoprevention.    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  Continued efforts on smoking cessation.  Plan We will reach out to you in January 2021 to schedule the bilateral diagnostic mammogram and follow up with Dr.Emil Weigold.   HPI, Physical Exam, Assessment and Plan have been scribed under the direction and in the presence of Robert Bellow, MD. [name], CMA  I have completed the exam and reviewed the above documentation for accuracy and completeness.  I agree with the above.  Haematologist has been used and any errors in dictation or transcription are unintentional.  [name], M.D., F.A.C.S.   [name] Keith Felten 02/26/2019, 9:32 AM

## 2019-02-26 NOTE — Patient Instructions (Signed)
We will reach out to you in January 2021 to schedule the bilateral diagnostic mammogram and follow up with Dr.Byrnett.

## 2019-03-05 ENCOUNTER — Other Ambulatory Visit: Payer: Self-pay | Admitting: Obstetrics and Gynecology

## 2019-03-05 MED ORDER — TERCONAZOLE 0.8 % VA CREA: 1.0000 | TOPICAL_CREAM | Freq: Every day | VAGINAL | 0 refills | Status: AC

## 2019-03-05 NOTE — Progress Notes (Signed)
Rx terazol for yeast vag sx not improved after diflucan. RTO if sx persist.

## 2019-03-08 ENCOUNTER — Other Ambulatory Visit (HOSPITAL_COMMUNITY)
Admission: RE | Admit: 2019-03-08 | Discharge: 2019-03-08 | Disposition: A | Discharge status: Home / Self Care | Payer: Managed Care, Other (non HMO) | Source: Ambulatory Visit | Attending: Advanced Practice Midwife | Admitting: Advanced Practice Midwife

## 2019-03-08 ENCOUNTER — Ambulatory Visit (INDEPENDENT_AMBULATORY_CARE_PROVIDER_SITE_OTHER): Payer: Managed Care, Other (non HMO) | Admitting: Advanced Practice Midwife

## 2019-03-08 ENCOUNTER — Other Ambulatory Visit: Payer: Self-pay

## 2019-03-08 ENCOUNTER — Encounter: Payer: Self-pay | Admitting: Advanced Practice Midwife

## 2019-03-08 VITALS — BP 100/70 | Ht 63.0 in | Wt 130.0 lb

## 2019-03-08 DIAGNOSIS — N898 Other specified noninflammatory disorders of vagina: Secondary | ICD-10-CM | POA: Diagnosis not present

## 2019-03-08 DIAGNOSIS — R300 Dysuria: Secondary | ICD-10-CM | POA: Diagnosis not present

## 2019-03-08 NOTE — Patient Instructions (Signed)
Perimenopause  Perimenopause is the normal time of life before and after menstrual periods stop completely (menopause). Perimenopause can begin 2-8 years before menopause, and it usually lasts for 1 year after menopause. During perimenopause, the ovaries may or may not produce an egg. What are the causes? This condition is caused by a natural change in hormone levels that happens as you get older. What increases the risk? This condition is more likely to start at an earlier age if you have certain medical conditions or treatments, including:  A tumor of the pituitary gland in the brain.  A disease that affects the ovaries and hormone production.  Radiation treatment for cancer.  Certain cancer treatments, such as chemotherapy or hormone (anti-estrogen) therapy.  Heavy smoking and excessive alcohol use.  Family history of early menopause. What are the signs or symptoms? Perimenopausal changes affect each woman differently. Symptoms of this condition may include:  Hot flashes.  Night sweats.  Irregular menstrual periods.  Decreased sex drive.  Vaginal dryness.  Headaches.  Mood swings.  Depression.  Memory problems or trouble concentrating.  Irritability.  Tiredness.  Weight gain.  Anxiety.  Trouble getting pregnant. How is this diagnosed? This condition is diagnosed based on your medical history, a physical exam, your age, your menstrual history, and your symptoms. Hormone tests may also be done. How is this treated? In some cases, no treatment is needed. You and your health care provider should make a decision together about whether treatment is necessary. Treatment will be based on your individual condition and preferences. Various treatments are available, such as:  Menopausal hormone therapy (MHT).  Medicines to treat specific symptoms.  Acupuncture.  Vitamin or herbal supplements. Before starting treatment, make sure to let your health care provider  know if you have a personal or family history of:  Heart disease.  Breast cancer.  Blood clots.  Diabetes.  Osteoporosis. Follow these instructions at home: Lifestyle  Do not use any products that contain nicotine or tobacco, such as cigarettes and e-cigarettes. If you need help quitting, ask your health care provider.  Eat a balanced diet that includes fresh fruits and vegetables, whole grains, soybeans, eggs, lean meat, and low-fat dairy.  Get at least 30 minutes of physical activity on 5 or more days each week.  Avoid alcoholic and caffeinated beverages, as well as spicy foods. This may help prevent hot flashes.  Get 7-8 hours of sleep each night.  Dress in layers that can be removed to help you manage hot flashes.  Find ways to manage stress, such as deep breathing, meditation, or journaling. General instructions  Keep track of your menstrual periods, including: ? When they occur. ? How heavy they are and how long they last. ? How much time passes between periods.  Keep track of your symptoms, noting when they start, how often you have them, and how long they last.  Take over-the-counter and prescription medicines only as told by your health care provider.  Take vitamin supplements only as told by your health care provider. These may include calcium, vitamin E, and vitamin D.  Use vaginal lubricants or moisturizers to help with vaginal dryness and improve comfort during sex.  Talk with your health care provider before starting any herbal supplements.  Keep all follow-up visits as told by your health care provider. This is important. This includes any group therapy or counseling. Contact a health care provider if:  You have heavy vaginal bleeding or pass blood clots.  Your period   lasts more than 2 days longer than normal.  Your periods are recurring sooner than 21 days.  You bleed after having sex. Get help right away if:  You have chest pain, trouble  breathing, or trouble talking.  You have severe depression.  You have pain when you urinate.  You have severe headaches.  You have vision problems. Summary  Perimenopause is the time when a woman's body begins to move into menopause. This may happen naturally or as a result of other health problems or medical treatments.  Perimenopause can begin 2-8 years before menopause, and it usually lasts for 1 year after menopause.  Perimenopausal symptoms can be managed through medicines, lifestyle changes, and complementary therapies such as acupuncture. This information is not intended to replace advice given to you by your health care provider. Make sure you discuss any questions you have with your health care provider. Document Released: 10/13/2004 Document Revised: 10/11/2016 Document Reviewed: 10/11/2016 Elsevier Interactive Patient Education  2019 [employer] Atrophic Vaginitis  Atrophic vaginitis is a condition in which the tissues that line the vagina become dry and thin. This condition is most common in women who have stopped having regular menstrual periods (are in menopause). This usually starts when a woman is 82-39 years old. That is the time when a woman's estrogen levels begin to drop (decrease). Estrogen is a female hormone. It helps to keep the tissues of the vagina moist. It stimulates the vagina to produce a clear fluid that lubricates the vagina for sexual intercourse. This fluid also protects the vagina from infection. Lack of estrogen can cause the lining of the vagina to get thinner and dryer. The vagina may also shrink in size. It may become less elastic. Atrophic vaginitis tends to get worse over time as a woman's estrogen level drops. What are the causes? This condition is caused by the normal drop in estrogen that happens around the time of menopause. What increases the risk? Certain conditions or situations may lower a woman's estrogen level, leading to a higher risk  for atrophic vaginitis. You are more likely to develop this condition if:  You are taking medicines that block estrogen.  You have had your ovaries removed.  You are being treated for cancer with X-ray (radiation) or medicines (chemotherapy).  You have given birth or are breastfeeding.  You are older than age 37.  You smoke. What are the signs or symptoms? Symptoms of this condition include:  Pain, soreness, or bleeding during sexual intercourse (dyspareunia).  Vaginal burning, irritation, or itching.  Pain or bleeding when a speculum is used in a vaginal exam (pelvic exam).  Having burning pain when passing urine.  Vaginal discharge that is brown or yellow. In some cases, there are no symptoms. How is this diagnosed? This condition is diagnosed by taking a medical history and doing a physical exam. This will include a pelvic exam that checks the vaginal tissues. Though rare, you may also have other tests, including:  A urine test.  A test that checks the acid balance in your vagina (acid balance test). How is this treated? Treatment for this condition depends on how severe your symptoms are. Treatment may include:  Using an over-the-counter vaginal lubricant before sex.  Using a long-acting vaginal moisturizer.  Using low-dose vaginal estrogen for moderate to severe symptoms that do not respond to other treatments. Options include creams, tablets, and inserts (vaginal rings). Before you use a vaginal estrogen, tell your health care provider if you have a  history of: ? Breast cancer. ? Endometrial cancer. ? Blood clots. If you are not sexually active and your symptoms are very mild, you may not need treatment. Follow these instructions at home: Medicines  Take over-the-counter and prescription medicines only as told by your health care provider. Do not use herbal or alternative medicines unless your health care provider says that you can.  Use over-the-counter creams,  lubricants, or moisturizers for dryness only as directed by your health care provider. General instructions  If your atrophic vaginitis is caused by menopause, discuss all of your menopause symptoms and treatment options with your health care provider.  Do not douche.  Do not use products that can make your vagina dry. These include: ? Scented feminine sprays. ? Scented tampons. ? Scented soaps.  Vaginal intercourse can help to improve blood flow and elasticity of vaginal tissue. If it hurts to have sex, try using a lubricant or moisturizer just before having intercourse. Contact a health care provider if:  Your discharge looks different than normal.  Your vagina has an unusual smell.  You have new symptoms.  Your symptoms do not improve with treatment.  Your symptoms get worse. Summary  Atrophic vaginitis is a condition in which the tissues that line the vagina become dry and thin. It is most common in women who have stopped having regular menstrual periods (are in menopause).  Treatment options include using vaginal lubricants and low-dose vaginal estrogen.  Contact a health care provider if your vagina has an unusual smell, or if your symptoms get worse or do not improve after treatment. This information is not intended to replace advice given to you by your health care provider. Make sure you discuss any questions you have with your health care provider. Document Released: 01/20/2015 Document Revised: 06/01/2017 Document Reviewed: 06/01/2017 Elsevier Interactive Patient Education  2019 [employer]. Vaginitis Vaginitis is a condition in which the vaginal tissue swells and becomes red (inflamed). This condition is most often caused by a change in the normal balance of bacteria and yeast that live in the vagina. This change causes an overgrowth of certain bacteria or yeast, which causes the inflammation. There are different types of vaginitis, but the most common types are:   Bacterial vaginosis.  Yeast infection (candidiasis).  Trichomoniasis vaginitis. This is a sexually transmitted disease (STD).  Viral vaginitis.  Atrophic vaginitis.  Allergic vaginitis. What are the causes? The cause of this condition depends on the type of vaginitis. It can be caused by:  Bacteria (bacterial vaginosis).  Yeast, which is a fungus (yeast infection).  A parasite (trichomoniasis vaginitis).  A virus (viral vaginitis).  Low hormone levels (atrophic vaginitis). Low hormone levels can occur during pregnancy, breastfeeding, or after menopause.  Irritants, such as bubble baths, scented tampons, and feminine sprays (allergic vaginitis). Other factors can change the normal balance of the yeast and bacteria that live in the vagina. These include:  Antibiotic medicines.  Poor hygiene.  Diaphragms, vaginal sponges, spermicides, birth control pills, and intrauterine devices (IUD).  Sex.  Infection.  Uncontrolled diabetes.  A weakened defense (immune) system. What increases the risk? This condition is more likely to develop in women who:  Smoke.  Use vaginal douches, scented tampons, or scented sanitary pads.  Wear tight-fitting pants.  Wear thong underwear.  Use oral birth control pills or an IUD.  Have sex without a condom.  Have multiple sex partners.  Have an STD.  Frequently use the spermicide nonoxynol-9.  Eat lots of foods  high in sugar.  Have uncontrolled diabetes.  Have low estrogen levels.  Have a weakened immune system from an immune disorder or medical treatment.  Are pregnant or breastfeeding. What are the signs or symptoms? Symptoms vary depending on the cause of the vaginitis. Common symptoms include:  Abnormal vaginal discharge. ? The discharge is white, gray, or yellow with bacterial vaginosis. ? The discharge is thick, white, and cheesy with a yeast infection. ? The discharge is frothy and yellow or greenish with  trichomoniasis.  A bad vaginal smell. The smell is fishy with bacterial vaginosis.  Vaginal itching, pain, or swelling.  Sex that is painful.  Pain or burning when urinating. Sometimes there are no symptoms. How is this diagnosed? This condition is diagnosed based on your symptoms and medical history. A physical exam, including a pelvic exam, will also be done. You may also have other tests, including:  Tests to determine the pH level (acidity or alkalinity) of your vagina.  A whiff test, to assess the odor that results when a sample of your vaginal discharge is mixed with a potassium hydroxide solution.  Tests of vaginal fluid. A sample will be examined under a microscope. How is this treated? Treatment varies depending on the type of vaginitis you have. Your treatment may include:  Antibiotic creams or pills to treat bacterial vaginosis and trichomoniasis.  Antifungal medicines, such as vaginal creams or suppositories, to treat a yeast infection.  Medicine to ease discomfort if you have viral vaginitis. Your sexual partner should also be treated.  Estrogen delivered in a cream, pill, suppository, or vaginal ring to treat atrophic vaginitis. If vaginal dryness occurs, lubricants and moisturizing creams may help. You may need to avoid scented soaps, sprays, or douches.  Stopping use of a product that is causing allergic vaginitis. Then using a vaginal cream to treat the symptoms. Follow these instructions at home: Lifestyle  Keep your genital area clean and dry. Avoid soap, and only rinse the area with water.  Do not douche or use tampons until your health care provider says it is okay to do so. Use sanitary pads, if needed.  Do not have sex until your health care provider approves. When you can return to sex, practice safe sex and use condoms.  Wipe from front to back. This avoids the spread of bacteria from the rectum to the vagina. General instructions  Take  over-the-counter and prescription medicines only as told by your health care provider.  If you were prescribed an antibiotic medicine, take or use it as told by your health care provider. Do not stop taking or using the antibiotic even if you start to feel better.  Keep all follow-up visits as told by your health care provider. This is important. How is this prevented?  Use mild, non-scented products. Do not use things that can irritate the vagina, such as fabric softeners. Avoid the following products if they are scented: ? Feminine sprays. ? Detergents. ? Tampons. ? Feminine hygiene products. ? Soaps or bubble baths.  Let air reach your genital area. ? Wear cotton underwear to reduce moisture buildup. ? Avoid wearing underwear while you sleep. ? Avoid wearing tight pants and underwear or nylons without a cotton panel. ? Avoid wearing thong underwear.  Take off any wet clothing, such as bathing suits, as soon as possible.  Practice safe sex and use condoms. Contact a health care provider if:  You have abdominal pain.  You have a fever.  You have symptoms  that last for more than 2-3 days. Get help right away if:  You have a fever and your symptoms suddenly get worse. Summary  Vaginitis is a condition in which the vaginal tissue becomes inflamed.This condition is most often caused by a change in the normal balance of bacteria and yeast that live in the vagina.  Treatment varies depending on the type of vaginitis you have.  Do not douche, use tampons , or have sex until your health care provider approves. When you can return to sex, practice safe sex and use condoms. This information is not intended to replace advice given to you by your health care provider. Make sure you discuss any questions you have with your health care provider. Document Released: 07/03/2007 Document Revised: 10/11/2016 Document Reviewed: 10/11/2016 Elsevier Interactive Patient Education  2019 [employer].

## 2019-03-08 NOTE — Progress Notes (Signed)
Patient ID: Diana Avila, female   DOB: 14-Sep-1971, 48 y.o.   MRN: [id number]  Reason for Consult: Vaginitis    Subjective:     HPI:  Diana Avila is a 48 y.o. female who was treated 3 weeks ago for UTI and yeast infection. She was also taking Tamoxifen for a short time but did not like the side effects so she quit taking that. Some of her symptoms improved but she has had some pain with urination and vaginal irritation that have persisted. She has vaginal dryness and she noticed a mild odor yesterday. She denies any current itching or odor. She mentions that she has tried to quit smoking but has not been successful. She is going to try the patch.  Past Medical History:  Diagnosis Date  . Anxiety    H/O  . Breast cyst, right 2015  . Breast mass    with exc in her 16s  . Cervical dysplasia   . Complication of anesthesia    WOKE UP DURING APPENDECTOMY  . Depression    H/O  . GERD (gastroesophageal reflux disease)    NO MEDS  . Headache    MIGRAINES   Family History  Problem Relation Age of Onset  . Hypertension Mother   . Other Father        colon polyps  . Hypertension Father   . Diverticulitis Father   . Gout Father   . Breast cancer Maternal Grandmother        ? age  . Cancer Maternal Grandfather        lung  . Breast cancer Paternal Grandmother        ? age  . Cancer Paternal Grandfather    Past Surgical History:  Procedure Laterality Date  . APPENDECTOMY     pt was 48 yrs old-DEVELOPED FLUID ON RIGHT LUNG AFTER SURGERY-NO PROBLEMS SINCE THEN  . BREAST BIOPSY Left 2004   neg  . BREAST BIOPSY Left 11/15/2018   affirm stereo biopsy for distortion/ x clip/ FEATURES OF COMPLEX SCLEROSING LESION, WITH SCLEROSING ADENOSIS  . BREAST BIOPSY Left 12/05/2018   Procedure: BREAST BIOPSY WITH NEEDLE LOCALIZATION, LEFT;  Surgeon: Robert Bellow, MD;  Location: [facility] ORS;  Service: General;  Laterality: Left;  . BREAST LUMPECTOMY Left 12/05/2018   radial scar  .  BREAST MASS EXCISION     in her 34s  . CRYOTHERAPY     in her 85s  . TUBAL LIGATION  2004    Short Social History:  Social History   Tobacco Use  . Smoking status: Current Every Day Smoker    Packs/day: 0.50    Years: 15.00    Pack years: 7.50    Types: Cigarettes  . Smokeless tobacco: Never Used  Substance Use Topics  . Alcohol use: Yes    Comment: occ    Allergies  Allergen Reactions  . Sulfa Antibiotics Hives and Rash    Current Outpatient Medications  Medication Sig Dispense Refill  . cetirizine (ZYRTEC) 10 MG chewable tablet Chew 10 mg by mouth daily.    Marland Kitchen terconazole (TERAZOL 3) 0.8 % vaginal cream Place 1 applicator vaginally at bedtime for 3 days. (Patient not taking: Reported on 03/08/2019) 20 g 0   No current facility-administered medications for this visit.    Review of Systems  Constitutional: Negative.   HENT: Negative.   Eyes: Negative.   Respiratory: Negative.   Cardiovascular: Negative.   Gastrointestinal: Negative.   Genitourinary:  Vaginal irritation, pain with urination  Musculoskeletal: Negative.   Skin: Negative.   Neurological: Negative.   Endo/Heme/Allergies: Negative.   Psychiatric/Behavioral: Negative.         Objective:  Objective   Vitals:   03/08/19 1325  BP: 100/70  Weight: 130 lb (59 kg)  Height: 5\' 3"  (1.6 m)   Body mass index is 23.03 kg/m. Constitutional: Well nourished, well developed female in no acute distress.  HEENT: normal Skin: Warm and dry.  Cardiovascular: Regular rate and rhythm.   Extremity: no edema  Respiratory: Clear to auscultation bilateral. Normal respiratory effort Abdomen: soft, nontender, nondistended, no abnormal masses, no epigastric pain Back: no CVAT Neuro: DTRs 2+, Cranial nerves grossly intact Psych: Alert and Oriented x3. No memory deficits. Normal mood and affect.  MS: normal gait, normal bilateral lower extremity ROM/strength/stability.  Pelvic exam:  is not limited by body  habitus EGBUS: within normal limits Vagina: within normal limits and with normal mucosa  Cervix: not evaluated   Data: Vaginitis swab collected Urine culture sent     Assessment/Plan:     48 yo G2 P2 female with vaginitis or UTI vs perimenopausal symptoms  Follow up with patient after labs result Instructions given regarding vaginitis and perimenopause Comfort measures reviewed  [facility] 03/08/2019, 2:07 PM

## 2019-03-10 LAB — URINE CULTURE

## 2019-03-11 ENCOUNTER — Ambulatory Visit: Payer: Managed Care, Other (non HMO) | Admitting: Obstetrics and Gynecology

## 2019-03-11 LAB — CERVICOVAGINAL ANCILLARY ONLY
Bacterial vaginitis: NEGATIVE
Candida vaginitis: NEGATIVE

## 2019-03-18 ENCOUNTER — Encounter: Payer: Self-pay | Admitting: Obstetrics and Gynecology

## 2019-03-19 ENCOUNTER — Other Ambulatory Visit: Payer: Self-pay | Admitting: Obstetrics and Gynecology

## 2019-03-19 MED ORDER — VARENICLINE TARTRATE 1 MG PO TABS: 1.0000 mg | ORAL_TABLET | Freq: Two times a day (BID) | ORAL | 1 refills | Status: DC

## 2019-03-19 MED ORDER — CHANTIX STARTING MONTH PAK 0.5 MG X 11 & 1 MG X 42 PO TABS: ORAL_TABLET | ORAL | 0 refills | Status: DC

## 2019-03-19 NOTE — Progress Notes (Signed)
Rx chantix for smoking cessation.

## 2019-04-06 ENCOUNTER — Other Ambulatory Visit: Payer: Self-pay | Admitting: Obstetrics and Gynecology

## 2019-04-16 ENCOUNTER — Encounter: Payer: Self-pay | Admitting: General Surgery

## 2019-09-18 ENCOUNTER — Other Ambulatory Visit: Payer: Self-pay

## 2019-09-18 DIAGNOSIS — N6092 Unspecified benign mammary dysplasia of left breast: Secondary | ICD-10-CM

## 2019-09-18 DIAGNOSIS — N6001 Solitary cyst of right breast: Secondary | ICD-10-CM

## 2019-11-11 ENCOUNTER — Telehealth: Payer: Self-pay

## 2019-11-11 ENCOUNTER — Ambulatory Visit
Admission: RE | Admit: 2019-11-11 | Discharge: 2019-11-11 | Disposition: A | Discharge status: Home / Self Care | Payer: Managed Care, Other (non HMO) | Source: Ambulatory Visit | Attending: Surgery | Admitting: Surgery

## 2019-11-11 DIAGNOSIS — N6092 Unspecified benign mammary dysplasia of left breast: Secondary | ICD-10-CM | POA: Diagnosis present

## 2019-11-11 DIAGNOSIS — N6001 Solitary cyst of right breast: Secondary | ICD-10-CM | POA: Diagnosis present

## 2019-11-11 NOTE — Telephone Encounter (Signed)
Patient notified of recent ultrasound results and verbalized understanding and has no further questions at this time.

## 2019-11-11 NOTE — Telephone Encounter (Signed)
-----   Message from Jules Husbands, MD sent at 11/11/2019  2:19 PM EST ----- Please let pt know mammo/us show small benign nodule. Another repeat imaging in 6 months, please keep appt w me ----- Message ----- From: Interface, Rad Results In Sent: 11/11/2019   1:38 PM EST To: Jules Husbands, MD

## 2019-11-20 ENCOUNTER — Ambulatory Visit: Payer: Managed Care, Other (non HMO) | Admitting: Surgery

## 2019-11-20 ENCOUNTER — Other Ambulatory Visit: Payer: Self-pay

## 2019-11-20 ENCOUNTER — Encounter: Payer: Self-pay | Admitting: Surgery

## 2019-11-20 VITALS — BP 109/75 | HR 80 | Temp 97.9°F | Resp 14 | Ht 63.0 in | Wt 127.0 lb

## 2019-11-20 DIAGNOSIS — N6092 Unspecified benign mammary dysplasia of left breast: Secondary | ICD-10-CM | POA: Diagnosis not present

## 2019-11-20 NOTE — Patient Instructions (Addendum)
Follow up in 6 months with an ultrasound of the left breast. We will schedule this for you.  Continue self breast exams. Call office for any new breast issues or concerns.

## 2019-11-22 NOTE — Progress Notes (Signed)
Outpatient Surgical Follow Up  11/22/2019  Diana Avila is an 49 y.o. female.   Chief Complaint  Patient presents with  . Follow-up    mammogram    HPI:  49 year old female with simple cyst on the left breast and radial scar.final pathology showed ADH.  Comes in for routine exam and to discuss imaging studies.  She did have a mammogram and ultrasound that I have personally reviewed showing evidence of multiple simple cyst laterally.  This is likely benign. She reports some intermittent breast discomfort.  She is concerned because had multiple family members with cancer including her brother with renal carcinoma.  Her grandmother had also history of breast cancer.  She denies any fevers any chills.  No new lumps in her breast.  No lymphadenopathy and no weight loss   Past Medical History:  Diagnosis Date  . Anxiety    H/O  . Breast cyst, right 2015  . Breast mass    with exc in her 33s  . Cervical dysplasia   . Complication of anesthesia    WOKE UP DURING APPENDECTOMY  . Depression    H/O  . GERD (gastroesophageal reflux disease)    NO MEDS  . Headache    MIGRAINES    Past Surgical History:  Procedure Laterality Date  . APPENDECTOMY     pt was 49 yrs old-DEVELOPED FLUID ON RIGHT LUNG AFTER SURGERY-NO PROBLEMS SINCE THEN  . BREAST BIOPSY Left 2004   neg  . BREAST BIOPSY Left 11/15/2018   affirm stereo biopsy for distortion/ x clip/ FEATURES OF COMPLEX SCLEROSING LESION, WITH SCLEROSING ADENOSIS  . BREAST BIOPSY Left 12/05/2018   Procedure: BREAST BIOPSY WITH NEEDLE LOCALIZATION, LEFT;  Surgeon: Robert Bellow, MD;  Location: [facility] ORS;  Service: General;  Laterality: Left;  . BREAST LUMPECTOMY Left 12/05/2018   radial scar  . BREAST MASS EXCISION     in her 55s  . CRYOTHERAPY     in her 76s  . TUBAL LIGATION  2004    Family History  Problem Relation Age of Onset  . Hypertension Mother   . Other Father        colon polyps  . Hypertension Father   .  Diverticulitis Father   . Gout Father   . Breast cancer Maternal Grandmother        ? age  . Lung cancer Maternal Grandfather   . Breast cancer Paternal Grandmother        ? age  . Brain cancer Paternal Grandfather   . Kidney cancer Brother     Social History:  reports that she has been smoking cigarettes. She has a 7.50 pack-year smoking history. She has never used smokeless tobacco. She reports current alcohol use. She reports that she does not use drugs.  Allergies:  Allergies  Allergen Reactions  . Sulfa Antibiotics Hives and Rash    Medications reviewed.    ROS Full ROS performed and is otherwise negative other than what is stated in HPI   BP 109/75   Pulse 80   Temp 97.9 F (36.6 C)   Resp 14   Ht 5\' 3"  (1.6 m)   Wt 127 lb (57.6 kg)   LMP 11/07/2019   SpO2 99%   BMI 22.50 kg/m   Physical Exam Vitals and nursing note reviewed. Exam conducted with a chaperone present.  Constitutional:      General: She is not in acute distress.    Appearance: Normal appearance. She is  normal weight.  Eyes:     General: No scleral icterus.       Right eye: No discharge.        Left eye: No discharge.  Pulmonary:     Effort: Pulmonary effort is normal. No respiratory distress.     Breath sounds: Normal breath sounds. No stridor.     Comments: Evidence of lumpy breast  Tissue bilaterally.  Prior lumpectomy scar on the left side.  There is no evidence of discrete masses that are suspicious for malignancy.  There is no evidence of lymphadenopathy.  There is no nipple discharge Abdominal:     General: Abdomen is flat. There is no distension.     Palpations: There is no mass.     Tenderness: There is no abdominal tenderness. There is no guarding.     Hernia: No hernia is present.  Musculoskeletal:        General: No swelling. Normal range of motion.  Skin:    General: Skin is warm and dry.     Capillary Refill: Capillary refill takes less than 2 seconds.  Neurological:      General: No focal deficit present.     Mental Status: She is alert and oriented to person, place, and time.  Psychiatric:        Mood and Affect: Mood normal.        Behavior: Behavior normal.        Thought Content: Thought content normal.        Judgment: Judgment normal.     Assessment/Plan:  49 year old female with simple cyst on the left breast and radial scar.final pathology showed ADH.  Only there is a small cyst on the left side that will require follow-up in 6 months.  She does have bilateral cyst that that are likely benign.  There is no suspicious lesions that warrant surgical intervention or biopsies.  We will see her back in 6 months Greater than 50% of the 25 minutes  visit was spent in counseling/coordination of care   Caroleen Hamman, MD [facility]

## 2020-03-02 IMAGING — MG DIGITAL DIAGNOSTIC BILAT W/ TOMO W/ CAD
5 of 8 series · 5 of 24 positions shown · non-contrast
Comparison: Previous exam(s).
COMPARISON: Previous exam(s).

Addendum:
CLINICAL DATA: Patient presents for a bilateral diagnostic
examination to follow-up a probable benign complicated cyst over the
1 o'clock position of the right breast 1 cm from the nipple.Patient
underwent stereotactic needle core biopsy left breast November 2018
demonstrating complex sclerosing lesion followed by excision
demonstrating ADH.

EXAM:
DIGITAL DIAGNOSTIC bilateral MAMMOGRAM WITH CAD AND TOMO
ULTRASOUND right BREAST

[R MLO synth-2D]
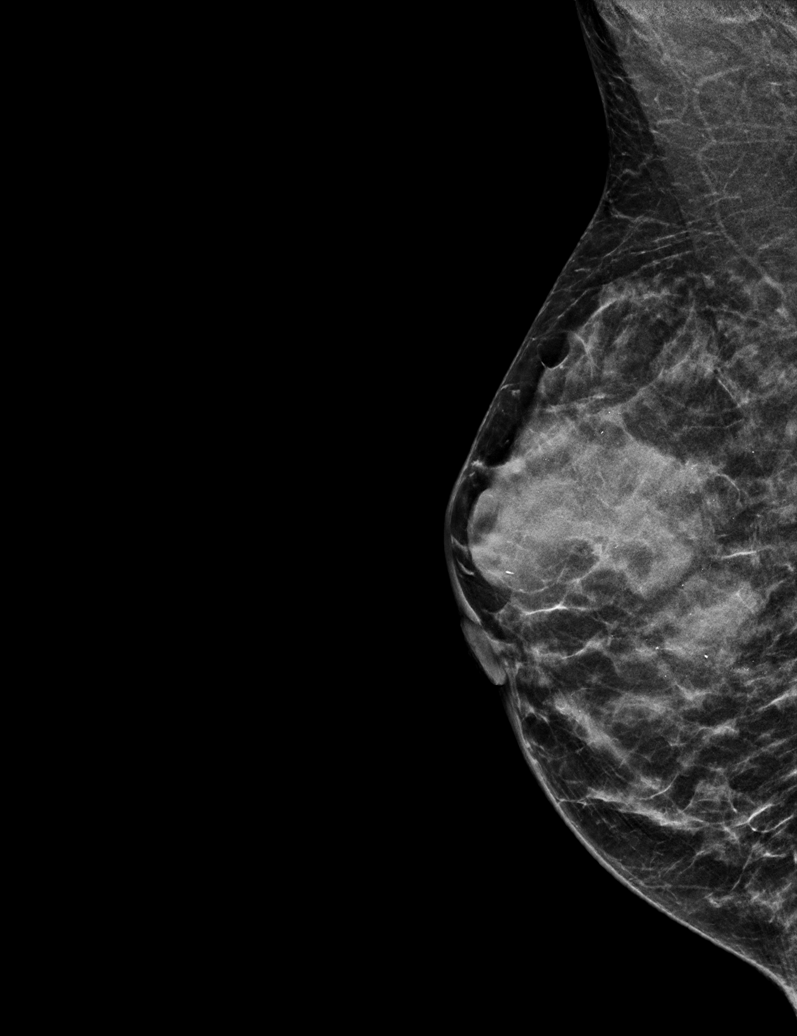

[R CC synth-2D]
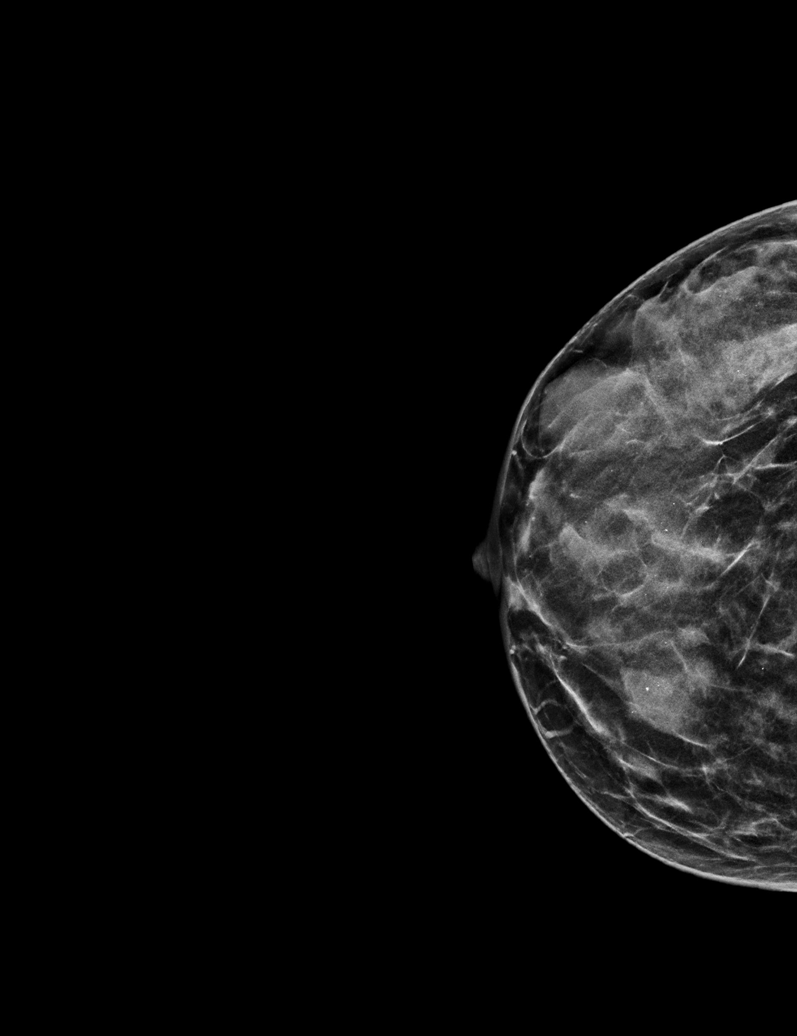

[L MLO synth-2D]
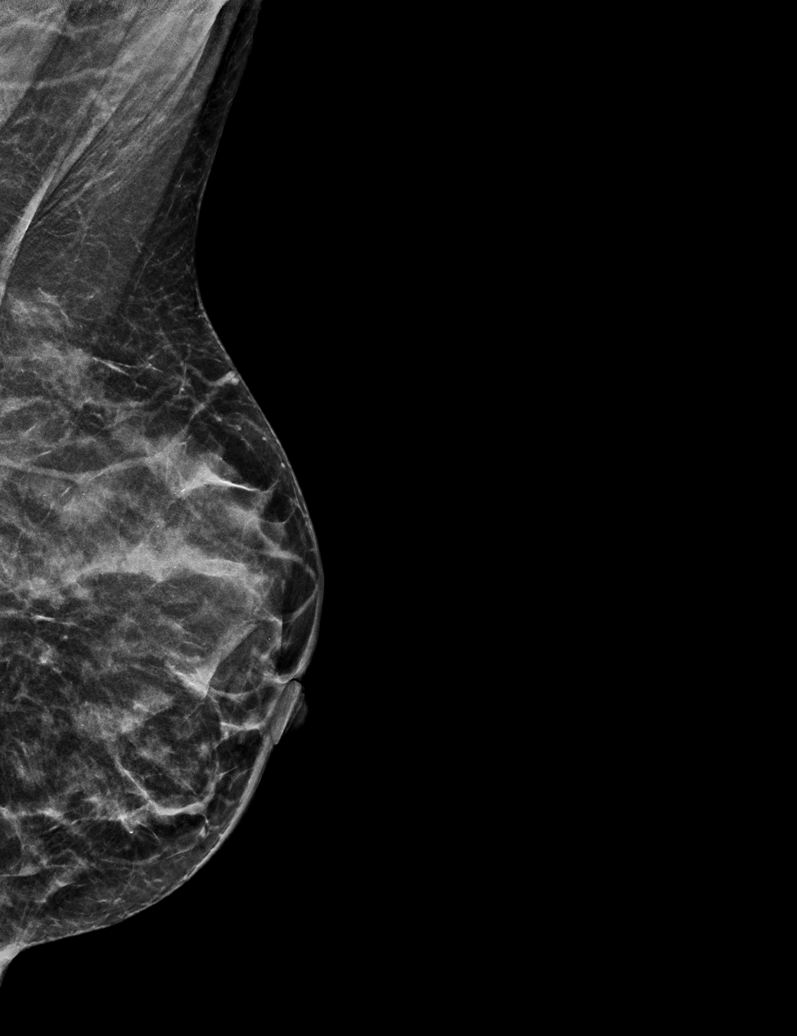

[L CC synth-2D]
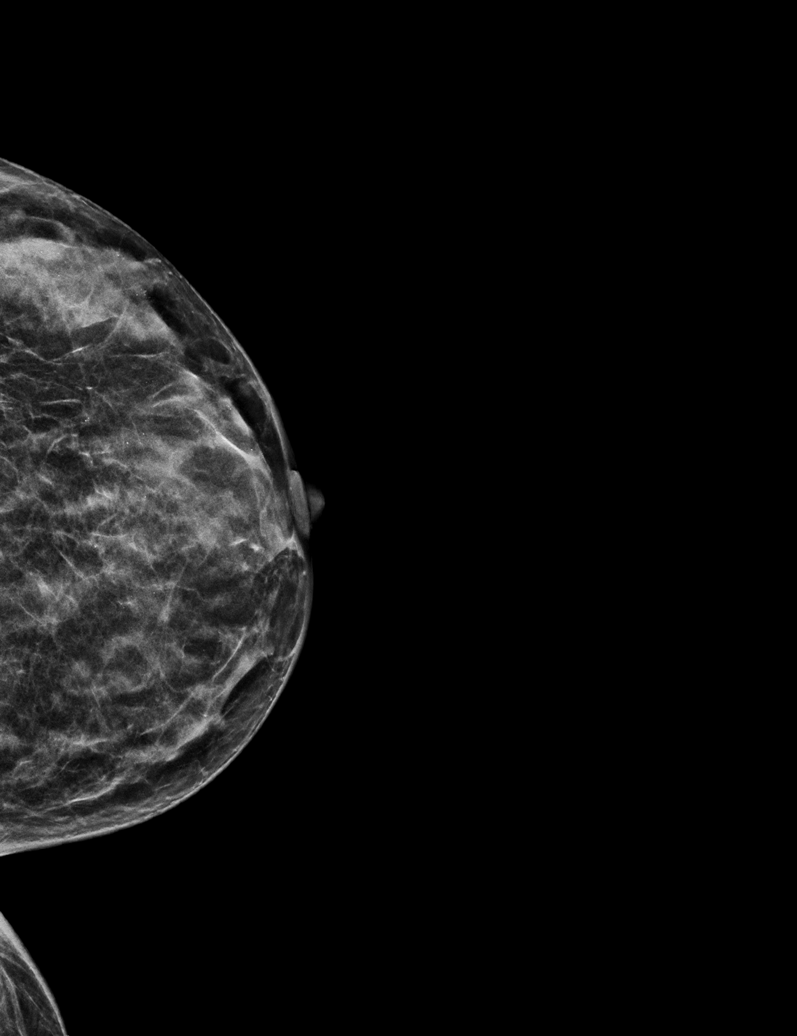

[R MLO tomo · tomo slice 19/37.0]
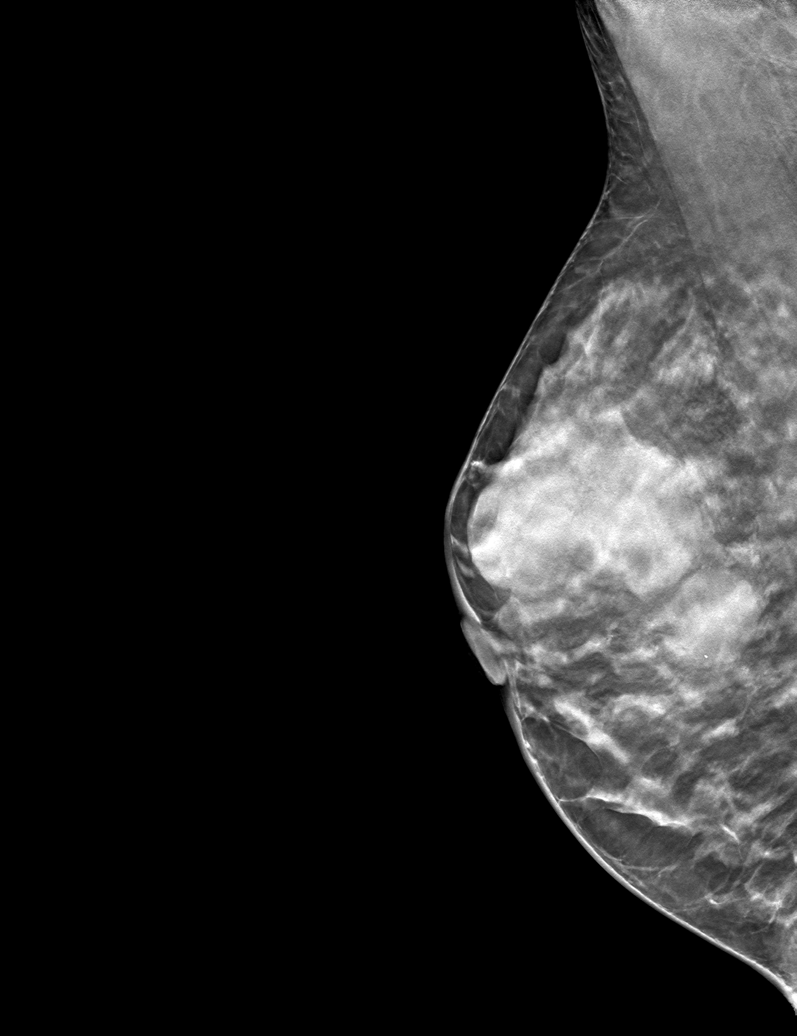

[5 of 24 positions shown; findings below may reference images not displayed]

ACR Breast Density Category c: The breast tissue is heterogeneously
dense, which may obscure small masses.
FINDINGS: Postsurgical changes over the upper-outer left breast. Evidence of
bilateral scattered and grouped round/punctate microcalcifications.
Several round/oval circumscribed right breast masses. Remainder the
exam is unchanged

Mammographic images were processed with CAD.

Targeted ultrasound is performed, showing a simple cyst over the 1
o'clock position of the right breast 1 cm from the nipple measuring
1.4 x 1.7 x 2 cm with immediately adjacent oval hypoechoic
circumscribed mass measuring 6 x 7 x 7 mm. This small adjacent oval
hypoechoic mass may have represented the complicated portion of the
previously described complicated cyst in this area. Incidentally
noted is a 2.6 cm simple cyst at the 12 o'clock position of the
right breast 1 cm from the nipple and 2.7 cm simple cyst over the 11
o'clock position of the right breast 2 cm from the nipple.
IMPRESSION: 1. Multiple simple cysts over the upper right periareolar region as
described. 7 mm oval circumscribed hypoechoic mass immediately
abutting the cyst at the 1 o'clock position. This is likely a benign
process and may represent a complicated cyst or fibroadenoma.

RECOMMENDATION:
Recommend six-month follow-up diagnostic left breast ultrasound to
document stability of this probable benign 7 mm mass.

I have discussed the findings and recommendations with the patient.
If applicable, a reminder letter will be sent to the patient
regarding the next appointment.

BI-RADS CATEGORY  3: Probably benign.

ADDENDUM:
Note that under the recommendation, there is a right left
discrepancy. It should state recommend six-month follow-up
diagnostic RIGHT breast ultrasound to document stability of this
probable benign 7 mm mass NOT LEFT.

*** End of Addendum ***
ACR Breast Density Category c: The breast tissue is heterogeneously
dense, which may obscure small masses.
FINDINGS: Postsurgical changes over the upper-outer left breast. Evidence of
bilateral scattered and grouped round/punctate microcalcifications.
Several round/oval circumscribed right breast masses. Remainder the
exam is unchanged

Mammographic images were processed with CAD.

Targeted ultrasound is performed, showing a simple cyst over the 1
o'clock position of the right breast 1 cm from the nipple measuring
1.4 x 1.7 x 2 cm with immediately adjacent oval hypoechoic
circumscribed mass measuring 6 x 7 x 7 mm. This small adjacent oval
hypoechoic mass may have represented the complicated portion of the
previously described complicated cyst in this area. Incidentally
noted is a 2.6 cm simple cyst at the 12 o'clock position of the
right breast 1 cm from the nipple and 2.7 cm simple cyst over the 11
o'clock position of the right breast 2 cm from the nipple.
IMPRESSION: 1. Multiple simple cysts over the upper right periareolar region as
described. 7 mm oval circumscribed hypoechoic mass immediately
abutting the cyst at the 1 o'clock position. This is likely a benign
process and may represent a complicated cyst or fibroadenoma.

RECOMMENDATION:
Recommend six-month follow-up diagnostic left breast ultrasound to
document stability of this probable benign 7 mm mass.

I have discussed the findings and recommendations with the patient.
If applicable, a reminder letter will be sent to the patient
regarding the next appointment.

BI-RADS CATEGORY  3: Probably benign.

## 2020-03-05 ENCOUNTER — Encounter: Payer: Self-pay | Admitting: Obstetrics and Gynecology

## 2020-03-06 ENCOUNTER — Other Ambulatory Visit: Payer: Self-pay

## 2020-03-06 DIAGNOSIS — N6001 Solitary cyst of right breast: Secondary | ICD-10-CM

## 2020-03-31 ENCOUNTER — Telehealth: Payer: Self-pay | Admitting: *Deleted

## 2020-03-31 NOTE — Telephone Encounter (Signed)
Patient called and stated that the order that was put in for mammogram was put in wrong, She stated that it was suppose to be right breast instead of left breast. Please call and advise

## 2020-04-01 NOTE — Addendum Note (Signed)
Addended by: Lesly Rubenstein on: 04/01/2020 08:45 AM   Modules accepted: Orders

## 2020-04-01 NOTE — Telephone Encounter (Signed)
Order corrected and patient will be contacted.

## 2020-04-02 ENCOUNTER — Other Ambulatory Visit: Payer: Self-pay

## 2020-04-02 ENCOUNTER — Ambulatory Visit: Payer: Managed Care, Other (non HMO) | Admitting: Family Medicine

## 2020-04-02 ENCOUNTER — Ambulatory Visit
Admission: RE | Admit: 2020-04-02 | Discharge: 2020-04-02 | Disposition: A | Discharge status: Home / Self Care | Payer: Managed Care, Other (non HMO) | Attending: Family Medicine | Admitting: Family Medicine

## 2020-04-02 ENCOUNTER — Ambulatory Visit
Admission: RE | Admit: 2020-04-02 | Discharge: 2020-04-02 | Disposition: A | Discharge status: Home / Self Care | Payer: Managed Care, Other (non HMO) | Source: Ambulatory Visit | Attending: Family Medicine | Admitting: Family Medicine

## 2020-04-02 ENCOUNTER — Encounter: Payer: Self-pay | Admitting: Family Medicine

## 2020-04-02 VITALS — BP 110/62 | HR 84 | Ht 63.0 in | Wt 128.0 lb

## 2020-04-02 DIAGNOSIS — M545 Low back pain, unspecified: Secondary | ICD-10-CM

## 2020-04-02 DIAGNOSIS — F32A Depression, unspecified: Secondary | ICD-10-CM

## 2020-04-02 DIAGNOSIS — G8929 Other chronic pain: Secondary | ICD-10-CM | POA: Insufficient documentation

## 2020-04-02 DIAGNOSIS — Z87828 Personal history of other (healed) physical injury and trauma: Secondary | ICD-10-CM

## 2020-04-02 DIAGNOSIS — Z7689 Persons encountering health services in other specified circumstances: Secondary | ICD-10-CM | POA: Diagnosis not present

## 2020-04-02 DIAGNOSIS — F419 Anxiety disorder, unspecified: Secondary | ICD-10-CM

## 2020-04-02 DIAGNOSIS — F329 Major depressive disorder, single episode, unspecified: Secondary | ICD-10-CM
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
## 2020-05-04 ENCOUNTER — Other Ambulatory Visit: Payer: Self-pay

## 2020-05-04 ENCOUNTER — Ambulatory Visit
Admission: RE | Admit: 2020-05-04 | Discharge: 2020-05-04 | Disposition: A | Discharge status: Home / Self Care | Payer: Managed Care, Other (non HMO) | Source: Ambulatory Visit | Attending: Surgery | Admitting: Surgery

## 2020-05-04 DIAGNOSIS — N6001 Solitary cyst of right breast: Secondary | ICD-10-CM | POA: Insufficient documentation

## 2020-05-05 ENCOUNTER — Telehealth: Payer: Self-pay

## 2020-05-05 NOTE — Telephone Encounter (Signed)
-----   Message from Jules Husbands, MD sent at 05/05/2020 10:45 AM EDT ----- Please let her know mammo shows a benign cyst ----- Message ----- From: Interface, Rad Results In Sent: 05/04/2020   6:34 PM EDT To: Jules Husbands, MD

## 2020-05-05 NOTE — Telephone Encounter (Signed)
Spoke with patient to notify her of recent mammogram results per Dr.Pabon "please let her know mammo shows benign cyst." Patient was made aware and has no further questions. Also, notified her he would discuss next steps at scheduled appointment on 05/18/20 at 3:30p. Patient verbalized understanding and has no further questions.

## 2020-05-18 ENCOUNTER — Other Ambulatory Visit: Payer: Self-pay

## 2020-05-18 ENCOUNTER — Ambulatory Visit (INDEPENDENT_AMBULATORY_CARE_PROVIDER_SITE_OTHER): Payer: Managed Care, Other (non HMO) | Admitting: Surgery

## 2020-05-18 ENCOUNTER — Encounter: Payer: Self-pay | Admitting: Surgery

## 2020-05-18 VITALS — BP 122/84 | HR 77 | Temp 99.0°F | Ht 64.0 in | Wt 127.8 lb

## 2020-05-18 DIAGNOSIS — N6001 Solitary cyst of right breast: Secondary | ICD-10-CM | POA: Diagnosis not present

## 2020-05-18 NOTE — Patient Instructions (Signed)

## 2020-05-19 ENCOUNTER — Encounter: Payer: Self-pay | Admitting: Surgery

## 2020-05-19 NOTE — Progress Notes (Signed)
Outpatient Surgical Follow Up  05/19/2020  Diana Avila is an 49 y.o. female.   Chief Complaint  Patient presents with  . Follow-up    6 mo f/u rec L breast Ultrasound [facility] 05/04/20    HPI: 49 year old female with hx left lumpectomy for  Breast lesion FEATURES OF COMPLEX SCLEROSING LESION, WITH SCLEROSING ADENOSIS,  COLUMNAR CELL CHANGE, AND APOCRINE METAPLASIA, performed by Dr. Bary Castilla 2020.  Comes in for routine exam and to discuss imaging studies.  She did have an ultrasound that I have personally reviewed showing evidence of a definitive cyst on the right breast 1 o'clock..  This is likely benign. She reports some intermittent breast discomfort bilaterally, moderate and dull pain..  She is concerned because had multiple family members with cancer including her brother with renal carcinoma.  Her grandmother had also history of breast cancer.  She denies any fevers any chills.  No new lumps in her breast.  No lymphadenopathy and no weight loss  Past Medical History:  Diagnosis Date  . Anxiety    H/O  . Breast cyst, right 2015  . Breast mass    with exc in her 71s  . Cervical dysplasia   . Complication of anesthesia    WOKE UP DURING APPENDECTOMY  . Depression    H/O  . GERD (gastroesophageal reflux disease)    NO MEDS  . Headache    MIGRAINES    Past Surgical History:  Procedure Laterality Date  . APPENDECTOMY     pt was 49 yrs old-DEVELOPED FLUID ON RIGHT LUNG AFTER SURGERY-NO PROBLEMS SINCE THEN  . BREAST BIOPSY Left 2004   neg  . BREAST BIOPSY Left 11/15/2018   affirm stereo biopsy for distortion/ x clip/ FEATURES OF COMPLEX SCLEROSING LESION, WITH SCLEROSING ADENOSIS  . BREAST BIOPSY Left 12/05/2018   Procedure: BREAST BIOPSY WITH NEEDLE LOCALIZATION, LEFT;  Surgeon: Robert Bellow, MD;  Location: [facility] ORS;  Service: General;  Laterality: Left;  . BREAST LUMPECTOMY Left 12/05/2018   radial scar  . BREAST MASS EXCISION     in her 67s  . CRYOTHERAPY     in her  59s  . TUBAL LIGATION  2004    Family History  Problem Relation Age of Onset  . Hypertension Mother   . Heart disease Mother   . Other Father        colon polyps  . Hypertension Father   . Diverticulitis Father   . Gout Father   . Breast cancer Maternal Grandmother        ? age  . Cancer Maternal Grandmother   . Lung cancer Maternal Grandfather   . Cancer Maternal Grandfather   . Breast cancer Paternal Grandmother        ? age  . Cancer Paternal Grandmother   . Brain cancer Paternal Grandfather   . Cancer Paternal Grandfather   . Stroke Sister   . Kidney cancer Brother   . Cancer Brother     Social History:  reports that she has been smoking cigarettes and e-cigarettes. She has a 7.50 pack-year smoking history. She has never used smokeless tobacco. She reports current alcohol use. She reports that she does not use drugs.  Allergies:  Allergies  Allergen Reactions  . Sulfa Antibiotics Hives and Rash    Medications reviewed.    ROS Full ROS performed and is otherwise negative other than what is stated in HPI   BP 122/84   Pulse 77   Temp 99  F (37.2 C) (Oral)   Ht 5\' 4"  (1.626 m)   Wt 127 lb 12.8 oz (58 kg)   SpO2 97%   BMI 21.94 kg/m   Physical Exam Vitals and nursing note reviewed. Exam conducted with a chaperone present.  Constitutional:      General: She is not in acute distress.    Appearance: Normal appearance. She is normal weight.  Cardiovascular:     Rate and Rhythm: Normal rate and regular rhythm.  Pulmonary:     Effort: Pulmonary effort is normal.     Breath sounds: Normal breath sounds. No stridor.     Comments: BREAST: Left lumpectomy scar, no defined masses on the left. On the right there is a single dominant cystic lesion that is palpable mildly tender to palpation 2 cms diameter located 1 o'clock. No evidence of LAD. Abdominal:     General: Abdomen is flat. There is no distension.     Palpations: Abdomen is soft. There is no mass.      Tenderness: There is no abdominal tenderness. There is no rebound.     Hernia: No hernia is present.  Musculoskeletal:        General: No swelling. Normal range of motion.     Cervical back: Normal range of motion and neck supple. No rigidity or tenderness.  Lymphadenopathy:     Cervical: No cervical adenopathy.  Skin:    General: Skin is warm and dry.     Capillary Refill: Capillary refill takes less than 2 seconds.     Coloration: Skin is not jaundiced.  Neurological:     General: No focal deficit present.     Mental Status: She is alert and oriented to person, place, and time.  Psychiatric:        Mood and Affect: Mood normal.        Behavior: Behavior normal.        Thought Content: Thought content normal.        Judgment: Judgment normal.     Assessment/Plan: Palpable benign Right breast cyst,  Mildly symptomatic, she wishes to continue expectant rx for now. We will see her on feb 2022. No need for surgical intervention. Hx left ADH/radial scar s/p lumpectomy.   Greater than 50% of the 25 minutes  visit was spent in counseling/coordination of care   Caroleen Hamman, MD [place] Surgeon

## 2020-09-25 ENCOUNTER — Other Ambulatory Visit: Payer: Self-pay

## 2020-09-25 DIAGNOSIS — Z1231 Encounter for screening mammogram for malignant neoplasm of breast: Secondary | ICD-10-CM

## 2020-10-12 ENCOUNTER — Other Ambulatory Visit: Payer: Self-pay

## 2020-10-12 ENCOUNTER — Ambulatory Visit
Admission: EM | Admit: 2020-10-12 | Discharge: 2020-10-12 | Disposition: A | Discharge status: Home / Self Care | Payer: Managed Care, Other (non HMO) | Attending: Family Medicine | Admitting: Family Medicine

## 2020-10-12 DIAGNOSIS — K529 Noninfective gastroenteritis and colitis, unspecified: Secondary | ICD-10-CM | POA: Diagnosis not present

## 2020-10-12 LAB — URINALYSIS, COMPLETE (UACMP) WITH MICROSCOPIC
Bilirubin Urine: NEGATIVE
Glucose, UA: NEGATIVE mg/dL
Hgb urine dipstick: NEGATIVE
Ketones, ur: NEGATIVE mg/dL
Leukocytes,Ua: NEGATIVE
Nitrite: NEGATIVE
Protein, ur: NEGATIVE mg/dL
Specific Gravity, Urine: >1.03 — ABNORMAL HIGH (ref 1.005–1.030)
pH: 6 (ref 5.0–8.0)

## 2020-10-12 MED ORDER — ONDANSETRON 4 MG PO TBDP: 4.0000 mg | ORAL_TABLET | Freq: Three times a day (TID) | ORAL | 0 refills | Status: DC | PRN

## 2020-10-12 MED ORDER — DIPHENOXYLATE-ATROPINE 2.5-0.025 MG PO TABS: 1.0000 | ORAL_TABLET | Freq: Four times a day (QID) | ORAL | 0 refills | Status: DC | PRN

## 2020-10-12 NOTE — Discharge Instructions (Signed)
Rest. Lots of fluids.  Medication as prescribed.  No evidence of UTI.  Take care  Dr. Lacinda Axon

## 2020-10-12 NOTE — ED Triage Notes (Addendum)
Pt presents with c/o lower abdominal pain with n/v/d and lower back pain since early this morning. Pt denies fevers. Pt denies urinary symptoms but is concerned about a UTI. Pt has not taken any meds for her symptoms.

## 2020-10-12 NOTE — ED Provider Notes (Signed)
[facility]    CSN: [id number] Arrival date & time: 10/12/20  1139      History   Chief Complaint Chief Complaint  Patient presents with  . Diarrhea  . Emesis  . Abdominal Pain   HPI   50 year old female presents with nausea, vomiting, diarrhea, abdominal pain.  Started abruptly this morning around 7:30 AM.  She reports nausea, vomiting, diarrhea, lower abdominal pain and cramping.  No fever.  Denies urinary symptoms.  Patient states that she has had a urinary tract infection which presented this way previously.  She is concerned that she has UTI.  Would like urinary testing today.  No medications or interventions tried.  No relieving factors.  No other complaints.  Past Medical History:  Diagnosis Date  . Anxiety    H/O  . Breast cyst, right 2015  . Breast mass    with exc in her 69s  . Cervical dysplasia   . Complication of anesthesia    WOKE UP DURING APPENDECTOMY  . Depression    H/O  . GERD (gastroesophageal reflux disease)    NO MEDS  . Headache    MIGRAINES    Patient Active Problem List   Diagnosis Date Noted  . Atypical ductal hyperplasia of left breast 12/13/2018  . Current tobacco use 07/18/2014    Past Surgical History:  Procedure Laterality Date  . APPENDECTOMY     pt was 50 yrs old-DEVELOPED FLUID ON RIGHT LUNG AFTER SURGERY-NO PROBLEMS SINCE THEN  . BREAST BIOPSY Left 2004   neg  . BREAST BIOPSY Left 11/15/2018   affirm stereo biopsy for distortion/ x clip/ FEATURES OF COMPLEX SCLEROSING LESION, WITH SCLEROSING ADENOSIS  . BREAST BIOPSY Left 12/05/2018   Procedure: BREAST BIOPSY WITH NEEDLE LOCALIZATION, LEFT;  Surgeon: Robert Bellow, MD;  Location: [facility] ORS;  Service: General;  Laterality: Left;  . BREAST LUMPECTOMY Left 12/05/2018   radial scar  . BREAST MASS EXCISION     in her 69s  . CRYOTHERAPY     in her 66s  . TUBAL LIGATION  2004    OB History    Gravida  2   Para  2   Term  2   Preterm      AB       Living  2     SAB      IAB      Ectopic      Multiple      Live Births  2            Home Medications    Prior to Admission medications   Medication Sig Start Date End Date Taking? Authorizing Provider  BIOTIN PO Take 1 tablet by mouth as needed.   Yes [provider]  diphenoxylate-atropine (LOMOTIL) 2.5-0.025 MG tablet Take 1 tablet by mouth 4 (four) times daily as needed for diarrhea or loose stools. 10/12/20  Yes Thersa Salt G, DO  hydrOXYzine (ATARAX/VISTARIL) 25 MG tablet SMARTSIG:0.5-4 Tablet(s) By Mouth 1 to 3 Times Daily PRN 04/21/20  Yes [provider]  Multiple Vitamin (MULTIVITAMIN PO) Take 1 tablet by mouth as needed.   Yes [provider]  sertraline (ZOLOFT) 50 MG tablet Take 50 mg by mouth daily. 05/16/20  Yes [provider]  ondansetron (ZOFRAN ODT) 4 MG disintegrating tablet Take 1 tablet (4 mg total) by mouth every 8 (eight) hours as needed for nausea or vomiting. 10/12/20   Coral Spikes, DO  traZODone (DESYREL) 50  MG tablet Take 1 tablet by mouth at bedtime. 07/07/20   [provider]  cetirizine (ZYRTEC) 10 MG chewable tablet Chew 10 mg by mouth daily.  10/12/20  [provider]    Family History Family History  Problem Relation Age of Onset  . Hypertension Mother   . Heart disease Mother   . Other Father        colon polyps  . Hypertension Father   . Diverticulitis Father   . Gout Father   . Breast cancer Maternal Grandmother        ? age  . Cancer Maternal Grandmother   . Lung cancer Maternal Grandfather   . Cancer Maternal Grandfather   . Breast cancer Paternal Grandmother        ? age  . Cancer Paternal Grandmother   . Brain cancer Paternal Grandfather   . Cancer Paternal Grandfather   . Stroke Sister   . Kidney cancer Brother   . Cancer Brother     Social History Social History   Tobacco Use  . Smoking status: Current Every Day Smoker    Packs/day: 0.50    Years: 15.00    Pack  years: 7.50    Types: Cigarettes, E-cigarettes  . Smokeless tobacco: Never Used  Vaping Use  . Vaping Use: Former  . Quit date: 11/29/2015  Substance Use Topics  . Alcohol use: Yes    Comment: occ  . Drug use: Never     Allergies   Sulfa antibiotics   Review of Systems Review of Systems  Gastrointestinal: Positive for abdominal pain, diarrhea, nausea and vomiting.  Genitourinary: Negative.    Physical Exam Triage Vital Signs ED Triage Vitals  Enc Vitals Group     BP 10/12/20 1203 94/64     Pulse Rate 10/12/20 1203 74     Resp 10/12/20 1203 17     Temp 10/12/20 1203 97.7 F (36.5 C)     Temp Source 10/12/20 1203 Oral     SpO2 10/12/20 1203 100 %     Weight 10/12/20 1155 125 lb (56.7 kg)     Height 10/12/20 1155 5\' 4"  (1.626 m)     Head Circumference --      Peak Flow --      Pain Score 10/12/20 1154 7     Pain Loc --      Pain Edu? --      Excl. in [place]? --    Updated Vital Signs BP 94/64 (BP Location: Right Arm)   Pulse 74   Temp 97.7 F (36.5 C) (Oral)   Resp 17   Ht 5\' 4"  (1.626 m)   Wt 56.7 kg   SpO2 100%   BMI 21.46 kg/m   Visual Acuity Right Eye Distance:   Left Eye Distance:   Bilateral Distance:    Right Eye Near:   Left Eye Near:    Bilateral Near:     Physical Exam Vitals and nursing note reviewed.  Constitutional:      General: She is not in acute distress.    Appearance: Normal appearance. She is not ill-appearing.  HENT:     Head: Normocephalic and atraumatic.  Eyes:     General:        Right eye: No discharge.        Left eye: No discharge.     Conjunctiva/sclera: Conjunctivae normal.  Cardiovascular:     Rate and Rhythm: Normal rate and regular rhythm.  Pulmonary:  Effort: Pulmonary effort is normal.     Breath sounds: Normal breath sounds. No wheezing, rhonchi or rales.  Abdominal:     General: There is no distension.     Palpations: Abdomen is soft.     Tenderness: There is no abdominal tenderness.  Neurological:      Mental Status: She is alert.  Psychiatric:        Mood and Affect: Mood normal.        Behavior: Behavior normal.    UC Treatments / Results  Labs (all labs ordered are listed, but only abnormal results are displayed) Labs Reviewed  URINALYSIS, COMPLETE (UACMP) WITH MICROSCOPIC - Abnormal; Notable for the following components:      Result Value   APPearance HAZY (*)    Specific Gravity, Urine >1.030 (*)    Bacteria, UA FEW (*)    All other components within normal limits  URINE CULTURE    EKG   Radiology No results found.  Procedures Procedures (including critical care time)  Medications Ordered in UC Medications - No data to display  Initial Impression / Assessment and Plan / UC Course  I have reviewed the triage vital signs and the nursing notes.  Pertinent labs & imaging results that were available during my care of the patient were reviewed by me and considered in my medical decision making (see chart for details).    50 year old female presents with gastroenteritis.  UA with no evidence of UTI.  Advised rest, fluids.  Zofran and Lomotil as prescribed.  Supportive care.  Final Clinical Impressions(s) / UC Diagnoses   Final diagnoses:  Gastroenteritis     Discharge Instructions     Rest. Lots of fluids.  Medication as prescribed.  No evidence of UTI.  Take care  Dr. Lacinda Axon    ED Prescriptions    Medication Sig Dispense Auth. Provider   ondansetron (ZOFRAN ODT) 4 MG disintegrating tablet  (Status: Discontinued) Take 1 tablet (4 mg total) by mouth every 8 (eight) hours as needed for nausea or vomiting. 20 tablet Chloe Bluett G, DO   diphenoxylate-atropine (LOMOTIL) 2.5-0.025 MG tablet Take 1 tablet by mouth 4 (four) times daily as needed for diarrhea or loose stools. 30 tablet Lily Velasquez G, DO   ondansetron (ZOFRAN ODT) 4 MG disintegrating tablet Take 1 tablet (4 mg total) by mouth every 8 (eight) hours as needed for nausea or vomiting. 20 tablet Coral Spikes, DO     PDMP not reviewed this encounter.   Coral Spikes, [place] 10/12/20 1247

## 2020-10-14 LAB — URINE CULTURE

## 2020-11-11 ENCOUNTER — Other Ambulatory Visit: Payer: Self-pay

## 2020-11-11 ENCOUNTER — Ambulatory Visit
Admission: RE | Admit: 2020-11-11 | Discharge: 2020-11-11 | Disposition: A | Discharge status: Home / Self Care | Payer: Managed Care, Other (non HMO) | Source: Ambulatory Visit | Attending: Surgery | Admitting: Surgery

## 2020-11-11 DIAGNOSIS — Z1231 Encounter for screening mammogram for malignant neoplasm of breast: Secondary | ICD-10-CM | POA: Diagnosis not present

## 2020-11-15 NOTE — Progress Notes (Signed)
11/15/20  Bilateral screening mammogram personally viewed.  No suspicious findings on either breast.  Results released to patient.  She has appointment with Dr. Dahlia Byes on 11/25/20.  Olean Ree, MD

## 2020-11-25 ENCOUNTER — Encounter: Payer: Self-pay | Admitting: Surgery

## 2020-11-25 ENCOUNTER — Other Ambulatory Visit: Payer: Self-pay

## 2020-11-25 ENCOUNTER — Ambulatory Visit (INDEPENDENT_AMBULATORY_CARE_PROVIDER_SITE_OTHER): Payer: Managed Care, Other (non HMO) | Admitting: Surgery

## 2020-11-25 VITALS — BP 121/80 | HR 76 | Temp 98.8°F | Ht 64.0 in | Wt 129.8 lb

## 2020-11-25 DIAGNOSIS — N6001 Solitary cyst of right breast: Secondary | ICD-10-CM | POA: Diagnosis not present

## 2020-11-25 NOTE — Patient Instructions (Addendum)
We will contact you January 2023 to schedule your mammogram and follow up breast exam for February 2023. If you do not hear from our office by the end of January please call our office.    Breast Self-Awareness Breast self-awareness is knowing how your breasts look and feel. Doing breast self-awareness is important. It allows you to catch a breast problem early while it is still small and can be treated. All women should do breast self-awareness, including women who have had breast implants. Tell your doctor if you notice a change in your breasts. What you need:  A mirror.  A well-lit room. How to do a breast self-exam A breast self-exam is one way to learn what is normal for your breasts and to check for changes. To do a breast self-exam: Look for changes 1. Take off all the clothes above your waist. 2. Stand in front of a mirror in a room with good lighting. 3. Put your hands on your hips. 4. Push your hands down. 5. Look at your breasts and nipples in the mirror to see if one breast or nipple looks different from the other. Check to see if: ? The shape of one breast is different. ? The size of one breast is different. ? There are wrinkles, dips, and bumps in one breast and not the other. 6. Look at each breast for changes in the skin, such as: ? Redness. ? Scaly areas. 7. Look for changes in your nipples, such as: ? Liquid around the nipples. ? Bleeding. ? Dimpling. ? Redness. ? A change in where the nipples are.   Feel for changes 1. Lie on your back on the floor. 2. Feel each breast. To do this, follow these steps: ? Pick a breast to feel. ? Put the arm closest to that breast above your head. ? Use your other arm to feel the nipple area of your breast. Feel the area with the pads of your three middle fingers by making small circles with your fingers. For the first circle, press lightly. For the second circle, press harder. For the third circle, press even harder. ? Keep  making circles with your fingers at the different pressures as you move down your breast. Stop when you feel your ribs. ? Move your fingers a little toward the center of your body. ? Start making circles with your fingers again, this time going up until you reach your collarbone. ? Keep making up-and-down circles until you reach your armpit. Remember to keep using the three pressures. ? Feel the other breast in the same way. 3. Sit or stand in the tub or shower. 4. With soapy water on your skin, feel each breast the same way you did in step 2 when you were lying on the floor.   Write down what you find Writing down what you find can help you remember what to tell your doctor. Write down:  What is normal for each breast.  Any changes you find in each breast, including: ? The kind of changes you find. ? Whether you have pain. ? Size and location of any lumps.  When you last had your menstrual period. General tips  Check your breasts every month.  If you are breastfeeding, the best time to check your breasts is after you feed your baby or after you use a breast pump.  If you get menstrual periods, the best time to check your breasts is 5-7 days after your menstrual period is over.  With time, you will become comfortable with the self-exam, and you will begin to know if there are changes in your breasts. Contact a doctor if you:  See a change in the shape or size of your breasts or nipples.  See a change in the skin of your breast or nipples, such as red or scaly skin.  Have fluid coming from your nipples that is not normal.  Find a lump or thick area that was not there before.  Have pain in your breasts.  Have any concerns about your breast health. Summary  Breast self-awareness includes looking for changes in your breasts, as well as feeling for changes within your breasts.  Breast self-awareness should be done in front of a mirror in a well-lit room.  You should check  your breasts every month. If you get menstrual periods, the best time to check your breasts is 5-7 days after your menstrual period is over.  Let your doctor know of any changes you see in your breasts, including changes in size, changes on the skin, pain or tenderness, or fluid from your nipples that is not normal. This information is not intended to replace advice given to you by your health care provider. Make sure you discuss any questions you have with your health care provider. Document Revised: 04/24/2018 Document Reviewed: 04/24/2018 Elsevier Patient Education  [place].

## 2020-11-27 ENCOUNTER — Encounter: Payer: Self-pay | Admitting: Surgery

## 2020-11-27 NOTE — Progress Notes (Signed)
Outpatient Surgical Follow Up  11/27/2020  Diana Avila is an 50 y.o. female.   Chief Complaint  Patient presents with  . Follow-up    Bil screening mammogram    HPI: 50 year old female with hx left lumpectomy for  Breast lesion FEATURES OF COMPLEX SCLEROSING LESION, WITH SCLEROSING ADENOSIS,  COLUMNAR CELL CHANGE, AND APOCRINE METAPLASIA, performed by Dr. Bary Castilla 2020.  She does have a hx of benign Right breast  Cyst. Comes in for routine exam and to discuss imaging studies. She did have an recent mammo  that I have personally reviewed showing  No evidence of  suspicious leisons. She reports some intermittent breast discomfort bilaterally but more pronounced on the Right side , moderate and dull pain.. She is concerned because had multiple family members with cancer including her brother with renal carcinoma. Her grandmother had also history of breast cancer. She denies any fevers any chills. No new lumps in her breast. No lymphadenopathy and no weight loss  Past Medical History:  Diagnosis Date  . Anxiety    H/O  . Breast cyst, right 2015  . Breast mass    with exc in her 77s  . Cervical dysplasia   . Complication of anesthesia    WOKE UP DURING APPENDECTOMY  . Depression    H/O  . GERD (gastroesophageal reflux disease)    NO MEDS  . Headache    MIGRAINES    Past Surgical History:  Procedure Laterality Date  . APPENDECTOMY     pt was 50 yrs old-DEVELOPED FLUID ON RIGHT LUNG AFTER SURGERY-NO PROBLEMS SINCE THEN  . BREAST BIOPSY Left 2004   neg  . BREAST BIOPSY Left 11/15/2018   affirm stereo biopsy for distortion/ x clip/ FEATURES OF COMPLEX SCLEROSING LESION, WITH SCLEROSING ADENOSIS  . BREAST BIOPSY Left 12/05/2018   Procedure: BREAST BIOPSY WITH NEEDLE LOCALIZATION, LEFT;  Surgeon: Robert Bellow, MD;  Location: [facility] ORS;  Service: General;  Laterality: Left;  . BREAST LUMPECTOMY Left 12/05/2018   radial scar  . BREAST MASS EXCISION     in her 40s  .  CRYOTHERAPY     in her 35s  . TUBAL LIGATION  2004    Family History  Problem Relation Age of Onset  . Hypertension Mother   . Heart disease Mother   . Other Father        colon polyps  . Hypertension Father   . Diverticulitis Father   . Gout Father   . Breast cancer Maternal Grandmother        ? age  . Cancer Maternal Grandmother   . Lung cancer Maternal Grandfather   . Cancer Maternal Grandfather   . Breast cancer Paternal Grandmother        ? age  . Cancer Paternal Grandmother   . Brain cancer Paternal Grandfather   . Cancer Paternal Grandfather   . Stroke Sister   . Kidney cancer Brother   . Cancer Brother     Social History:  reports that she has been smoking cigarettes and e-cigarettes. She has a 7.50 pack-year smoking history. She has never used smokeless tobacco. She reports current alcohol use. She reports that she does not use drugs.  Allergies:  Allergies  Allergen Reactions  . Sulfa Antibiotics Hives and Rash    Medications reviewed.    ROS Full ROS performed and is otherwise negative other than what is stated in HPI   BP 121/80   Pulse 76   Temp 98.8  F (37.1 C) (Oral)   Ht 5\' 4"  (1.626 m)   Wt 129 lb 12.8 oz (58.9 kg)   SpO2 96%   BMI 22.28 kg/m   Physical Exam   Physical Exam Vitals and nursing note reviewed. Exam conducted with a chaperone present.  Constitutional:      General: She is not in acute distress.    Appearance: Normal appearance. She is normal weight.  Cardiovascular:     Rate and Rhythm: Normal rate and regular rhythm.  Pulmonary:     Effort: Pulmonary effort is normal.     Breath sounds: Normal breath sounds. No stridor.     Comments: BREAST: Left lumpectomy scar, no defined masses on the left. On the right there is a single dominant cystic lesion that is palpable mildly tender to palpation 2 cms diameter located 1 o'clock. No evidence of LAD. Abdominal:     General: Abdomen is flat. There is no distension.      Palpations: Abdomen is soft. There is no mass.     Tenderness: There is no abdominal tenderness. There is no rebound.     Hernia: No hernia is present.  Musculoskeletal:        General: No swelling. Normal range of motion.     Cervical back: Normal range of motion and neck supple. No rigidity or tenderness.  Lymphadenopathy:     Cervical: No cervical adenopathy.  Skin:    General: Skin is warm and dry.     Capillary Refill: Capillary refill takes less than 2 seconds.     Coloration: Skin is not jaundiced.  Neurological:     General: No focal deficit present.     Mental Status: She is alert and oriented to person, place, and time.  Psychiatric:        Mood and Affect: Mood normal.        Behavior: Behavior normal.        Thought Content: Thought content normal.        Judgment: Judgment normal.    Assessment/Plan: Diana Avila is a 50 year old female with benign but symptomatic cyst on the right side.  Discussed with patient in detail about options and patient is comfortable waiting following with a mammogram and physical exam in 1 year.  If the patient's symptoms were to be called more significant she might require excision but at this point symptoms are tolerable and she wishes to continue observation and follow-up.  Greater than 50% of the 25 minutes  visit was spent in counseling/coordination of care   Caroleen Hamman, MD [place] Surgeon

## 2021-11-15 ENCOUNTER — Other Ambulatory Visit: Payer: Self-pay

## 2021-11-15 DIAGNOSIS — Z1231 Encounter for screening mammogram for malignant neoplasm of breast: Secondary | ICD-10-CM

## 2021-12-01 ENCOUNTER — Inpatient Hospital Stay: Admission: RE | Admit: 2021-12-01 | Payer: Managed Care, Other (non HMO) | Source: Ambulatory Visit

## 2021-12-13 ENCOUNTER — Ambulatory Visit: Payer: Managed Care, Other (non HMO) | Admitting: Surgery
